# Patient Record
Sex: Female | Born: 1935 | Race: White | Hispanic: No
Health system: Southern US, Community
[De-identification: ages and names within clinical notes are randomized; demographics above are authoritative.]

---

## 1997-09-22 ENCOUNTER — Inpatient Hospital Stay (HOSPITAL_COMMUNITY): Admission: RE | Admit: 1997-09-22 | Discharge: 1997-09-25 | Payer: Self-pay | Admitting: Neurosurgery

## 2000-08-27 ENCOUNTER — Other Ambulatory Visit: Admission: RE | Admit: 2000-08-27 | Discharge: 2000-08-27 | Payer: Self-pay | Admitting: Obstetrics and Gynecology

## 2000-10-23 ENCOUNTER — Encounter: Payer: Self-pay | Admitting: Internal Medicine

## 2000-10-26 ENCOUNTER — Other Ambulatory Visit: Admission: RE | Admit: 2000-10-26 | Discharge: 2000-10-26 | Payer: Self-pay | Admitting: Internal Medicine

## 2000-10-26 ENCOUNTER — Encounter (INDEPENDENT_AMBULATORY_CARE_PROVIDER_SITE_OTHER): Payer: Self-pay

## 2002-08-03 ENCOUNTER — Encounter: Payer: Self-pay | Admitting: Internal Medicine

## 2002-08-03 ENCOUNTER — Encounter: Admission: RE | Admit: 2002-08-03 | Discharge: 2002-08-03 | Payer: Self-pay | Admitting: Internal Medicine

## 2002-08-22 ENCOUNTER — Encounter: Payer: Self-pay | Admitting: Internal Medicine

## 2002-08-22 ENCOUNTER — Encounter: Admission: RE | Admit: 2002-08-22 | Discharge: 2002-08-22 | Payer: Self-pay | Admitting: Internal Medicine

## 2002-11-07 ENCOUNTER — Encounter: Payer: Self-pay | Admitting: Neurosurgery

## 2002-11-08 ENCOUNTER — Encounter: Payer: Self-pay | Admitting: Neurosurgery

## 2002-11-08 ENCOUNTER — Inpatient Hospital Stay (HOSPITAL_COMMUNITY): Admission: RE | Admit: 2002-11-08 | Discharge: 2002-11-10 | Payer: Self-pay | Admitting: Neurosurgery

## 2003-01-03 ENCOUNTER — Encounter: Payer: Self-pay | Admitting: Neurosurgery

## 2003-01-03 ENCOUNTER — Encounter: Admission: RE | Admit: 2003-01-03 | Discharge: 2003-01-03 | Payer: Self-pay | Admitting: Neurosurgery

## 2003-02-14 ENCOUNTER — Encounter: Payer: Self-pay | Admitting: Neurosurgery

## 2003-02-14 ENCOUNTER — Inpatient Hospital Stay (HOSPITAL_COMMUNITY): Admission: RE | Admit: 2003-02-14 | Discharge: 2003-02-17 | Payer: Self-pay | Admitting: Neurosurgery

## 2003-12-10 ENCOUNTER — Ambulatory Visit (HOSPITAL_COMMUNITY): Admission: RE | Admit: 2003-12-10 | Discharge: 2003-12-10 | Payer: Self-pay | Admitting: Neurosurgery

## 2003-12-27 ENCOUNTER — Encounter: Admission: RE | Admit: 2003-12-27 | Discharge: 2003-12-27 | Payer: Self-pay | Admitting: Orthopaedic Surgery

## 2003-12-28 ENCOUNTER — Ambulatory Visit (HOSPITAL_BASED_OUTPATIENT_CLINIC_OR_DEPARTMENT_OTHER): Admission: RE | Admit: 2003-12-28 | Discharge: 2003-12-28 | Payer: Self-pay | Admitting: Orthopaedic Surgery

## 2003-12-28 ENCOUNTER — Ambulatory Visit (HOSPITAL_COMMUNITY): Admission: RE | Admit: 2003-12-28 | Discharge: 2003-12-28 | Payer: Self-pay | Admitting: Orthopaedic Surgery

## 2005-12-20 ENCOUNTER — Encounter: Admission: RE | Admit: 2005-12-20 | Discharge: 2005-12-20 | Payer: Self-pay | Admitting: Orthopaedic Surgery

## 2006-03-12 ENCOUNTER — Inpatient Hospital Stay (HOSPITAL_COMMUNITY): Admission: EM | Admit: 2006-03-12 | Discharge: 2006-03-25 | Payer: Self-pay | Admitting: Emergency Medicine

## 2006-03-13 ENCOUNTER — Encounter (INDEPENDENT_AMBULATORY_CARE_PROVIDER_SITE_OTHER): Payer: Self-pay | Admitting: *Deleted

## 2006-03-16 ENCOUNTER — Encounter (INDEPENDENT_AMBULATORY_CARE_PROVIDER_SITE_OTHER): Payer: Self-pay | Admitting: *Deleted

## 2006-03-20 ENCOUNTER — Encounter (INDEPENDENT_AMBULATORY_CARE_PROVIDER_SITE_OTHER): Payer: Self-pay | Admitting: *Deleted

## 2006-03-24 ENCOUNTER — Encounter (INDEPENDENT_AMBULATORY_CARE_PROVIDER_SITE_OTHER): Payer: Self-pay | Admitting: *Deleted

## 2006-10-23 ENCOUNTER — Encounter: Admission: RE | Admit: 2006-10-23 | Discharge: 2006-10-23 | Payer: Self-pay | Admitting: Cardiology

## 2006-10-29 ENCOUNTER — Ambulatory Visit (HOSPITAL_COMMUNITY): Admission: RE | Admit: 2006-10-29 | Discharge: 2006-10-30 | Payer: Self-pay | Admitting: Cardiology

## 2007-09-21 ENCOUNTER — Ambulatory Visit: Payer: Self-pay | Admitting: Emergency Medicine

## 2007-09-21 ENCOUNTER — Observation Stay (HOSPITAL_COMMUNITY): Admission: EM | Admit: 2007-09-21 | Discharge: 2007-09-24 | Payer: Self-pay | Admitting: Emergency Medicine

## 2007-09-21 ENCOUNTER — Encounter (INDEPENDENT_AMBULATORY_CARE_PROVIDER_SITE_OTHER): Payer: Self-pay | Admitting: Internal Medicine

## 2007-10-21 ENCOUNTER — Ambulatory Visit: Payer: Self-pay | Admitting: Emergency Medicine

## 2007-10-21 DIAGNOSIS — J309 Allergic rhinitis, unspecified: Secondary | ICD-10-CM | POA: Insufficient documentation

## 2007-10-21 DIAGNOSIS — F329 Major depressive disorder, single episode, unspecified: Secondary | ICD-10-CM

## 2007-10-21 DIAGNOSIS — I10 Essential (primary) hypertension: Secondary | ICD-10-CM | POA: Insufficient documentation

## 2007-10-21 DIAGNOSIS — I2789 Other specified pulmonary heart diseases: Secondary | ICD-10-CM

## 2007-10-21 DIAGNOSIS — I509 Heart failure, unspecified: Secondary | ICD-10-CM | POA: Insufficient documentation

## 2007-10-21 DIAGNOSIS — J45909 Unspecified asthma, uncomplicated: Secondary | ICD-10-CM | POA: Insufficient documentation

## 2007-10-21 DIAGNOSIS — E039 Hypothyroidism, unspecified: Secondary | ICD-10-CM | POA: Insufficient documentation

## 2007-10-21 DIAGNOSIS — K5730 Diverticulosis of large intestine without perforation or abscess without bleeding: Secondary | ICD-10-CM | POA: Insufficient documentation

## 2007-10-21 DIAGNOSIS — D509 Iron deficiency anemia, unspecified: Secondary | ICD-10-CM

## 2007-10-21 DIAGNOSIS — E785 Hyperlipidemia, unspecified: Secondary | ICD-10-CM

## 2007-10-21 DIAGNOSIS — F3289 Other specified depressive episodes: Secondary | ICD-10-CM | POA: Insufficient documentation

## 2007-11-12 ENCOUNTER — Ambulatory Visit (HOSPITAL_BASED_OUTPATIENT_CLINIC_OR_DEPARTMENT_OTHER): Admission: RE | Admit: 2007-11-12 | Discharge: 2007-11-12 | Payer: Self-pay | Admitting: Emergency Medicine

## 2007-11-12 ENCOUNTER — Ambulatory Visit: Payer: Self-pay | Admitting: Pulmonary Disease

## 2007-11-19 ENCOUNTER — Ambulatory Visit: Payer: Self-pay | Admitting: Emergency Medicine

## 2007-11-24 ENCOUNTER — Encounter: Payer: Self-pay | Admitting: Emergency Medicine

## 2007-12-10 ENCOUNTER — Ambulatory Visit: Payer: Self-pay | Admitting: Emergency Medicine

## 2007-12-10 DIAGNOSIS — G4730 Sleep apnea, unspecified: Secondary | ICD-10-CM

## 2007-12-10 DIAGNOSIS — G4710 Hypersomnia, unspecified: Secondary | ICD-10-CM | POA: Insufficient documentation

## 2008-02-18 ENCOUNTER — Ambulatory Visit: Payer: Self-pay | Admitting: Emergency Medicine

## 2008-03-09 ENCOUNTER — Telehealth: Payer: Self-pay | Admitting: Emergency Medicine

## 2008-05-30 DIAGNOSIS — Z860100 Personal history of colon polyps, unspecified: Secondary | ICD-10-CM | POA: Insufficient documentation

## 2008-05-30 DIAGNOSIS — R109 Unspecified abdominal pain: Secondary | ICD-10-CM | POA: Insufficient documentation

## 2008-05-30 DIAGNOSIS — K649 Unspecified hemorrhoids: Secondary | ICD-10-CM | POA: Insufficient documentation

## 2008-05-30 DIAGNOSIS — R197 Diarrhea, unspecified: Secondary | ICD-10-CM

## 2008-06-05 ENCOUNTER — Ambulatory Visit: Payer: Self-pay | Admitting: Internal Medicine

## 2008-12-18 ENCOUNTER — Ambulatory Visit: Payer: Self-pay | Admitting: Internal Medicine

## 2009-08-08 ENCOUNTER — Ambulatory Visit: Payer: Self-pay | Admitting: Emergency Medicine

## 2010-02-07 ENCOUNTER — Ambulatory Visit: Payer: Self-pay | Admitting: Emergency Medicine

## 2010-02-07 DIAGNOSIS — R0902 Hypoxemia: Secondary | ICD-10-CM | POA: Insufficient documentation

## 2010-06-06 NOTE — Assessment & Plan Note (Signed)
Summary: asthma, pulm HTN   Visit Type:  Follow-up Copy to:  self Primary Provider/Referring Provider:  Joselyn Arrow MD  CC:  PAH. Asthma.  Patient last seen 02/21/2008. Oxygen recertification needed. The patient has not worn her oxygen in over a year. she does c/o increased sob with exertion.[name]  History of Present Illness: 77 yo woman with hx HTN, anemia, DM, hypothyroidism followed by Dr Eldridge Dace. Was admitted to [facility] 5/19 - 5/22 for dyspnea, felt to have mild fluid overload and pulm edema. She responded well to diuresis. TTE showed vigorous LV fxn, slightly thickened LV wall, mild MR, dilated RA and RV, estimated PASP . CXR with CM, mild interstitial infiltrates. V/Q scan was low prob. ANA and RF were negative.   PFT's show severe AFL, PSG with OSA (RHI 15/h). Last time we started as needed albuterol. She did not want CPAP.   ROV 08/08/09 -- first f/u since '09. Tells me that she isn't feeling limited with regard to exertion by breathing. She does feel limited by back pain. She is 87% at rest on RA in the office today. Has ProAir to use as needed, but rarely feels she needs it.   Current Medications (verified): 1)  Furosemide 20 Mg  Tabs (Furosemide) .... Take 1 Tablet By Mouth Once A Day 2)  Synthroid 100 Mcg  Tabs (Levothyroxine Sodium) .... Take 1 Tablet By Mouth Once A Day 3)  Citalopram Hydrobromide 20 Mg  Tabs (Citalopram Hydrobromide) .... Take 1 Tablet By Mouth Once A Day 4)  Lisinopril 40 Mg  Tabs (Lisinopril) .... Take 1 Tablet By Mouth Once A Day 5)  Glyburide 1.25 Mg  Tabs (Glyburide) .... Take 1 Tablet By Mouth Once A Day 6)  Oxygen .... 2l As Needed 7)  Proair Hfa 108 (90 Base) Mcg/act Aers (Albuterol Sulfate) .... Inhale 2 Puffs Every 4 Hours As Needed 8)  Amlodipine Besylate 5 Mg Tabs (Amlodipine Besylate) .[name].. 1 By Mouth Daily 9)  Simvastatin 10 Mg Tabs (Simvastatin) .[name].. 1 By Mouth Daily 10)  Zyrtec Allergy 10 Mg Tabs (Cetirizine Hcl) .[name].. 1 By Mouth Daily  Allergies  (verified): 1)  ! Penicillin 2)  ! Codeine  Vital Signs:  Patient profile:   77 year old female Height:      63 inches (160.02 cm) Weight:      204 pounds (92.73 kg) O2 Sat:      87 % on Room air Temp:     98.8 degrees F (37.11 degrees C) oral Pulse rate:   66 / minute BP sitting:   130 / 70  (left arm) Cuff size:   large  Vitals Entered By: Michel Bickers CMA (August 08, 2009 11:35 AM)  O2 Sat at Rest %:  87 O2 Flow:  Room air  O2 Sat Comments The patient came in on room air and her sats were at 87% after walking. She was placed on 2 liters of oxygen and her sats recovered to 96%.Michel Bickers CMA  August 08, 2009 11:36 AM  Physical Exam  General:  elderly and hard of hearing. Moves around very slowly. Head:  normocephalic and atraumatic Eyes:  conjunctiva and sclera clear Nose:  no deformity, discharge, inflammation, or lesions Mouth:  no deformity or lesions Neck:  Supple; no masses or thyromegaly. Lungs:  Clear throughout to auscultation. Heart:  Regular rate and rhythm; no murmurs, rubs or bruits. Abdomen:  not examined Msk:  Symmetrical with no gross deformities. Normal posture. Extremities:  no  clubbing, cyanosis, edema, or deformity noted Neurologic:  intact Psych:  alert and cooperative; normal mood and affect; normal attention span and concentration   Impression & Recommendations:  Problem # 1:  ASTHMA (ICD-493.90) Severe by PFT, but she denies significant symptoms.  - as needed SABA (ventolin sample given) - requalified for oxygen with exertion today, 87% after walking to exam room on RA - ROV 6 mon or as needed   Problem # 2:  PULMONARY HYPERTENSION (ICD-416.8)  Orders: Est. Patient Level III ([address]109)  Medications Added to Medication List This Visit: 1)  Amlodipine Besylate 5 Mg Tabs (Amlodipine besylate) .[name].. 1 by mouth daily 2)  Simvastatin 10 Mg Tabs (Simvastatin) .[name].. 1 by mouth daily 3)  Zyrtec Allergy 10 Mg Tabs (Cetirizine hcl) .[name].. 1 by mouth  daily  Patient Instructions: 1)  You qualify for oxygen to be worn with exertion. We will reorder this. Please wear the oxygen on 2L/min whenever you are walking.  2)  Use ventolin 2 puffs up to every 4 hours if needed for shortness of breath.  3)  Follow up with Dr Delton Coombes in 6 months or as needed  Prescriptions: PROAIR HFA 108 (90 BASE) MCG/ACT AERS (ALBUTEROL SULFATE) inhale 2 puffs every 4 hours as needed  #1 x 5   Entered and Authorized by:   Leslye Peer MD   Signed by:   Leslye Peer MD on 08/08/2009   Method used:   Electronically to        [facility] [address] [phone] (retail)       [address]       [place], [place]  [id number]       Ph: [phone] or [phone]       Fax: [phone]   RxID:   [id number]    Immunization History:  Influenza Immunization History:    Influenza:  historical (03/05/2009)

## 2010-06-06 NOTE — Assessment & Plan Note (Signed)
Summary: asthma, hypoxemia   Visit Type:  Follow-up Copy to:  self Primary Provider/Referring Provider:  Joselyn Arrow MD  CC:  Asthma...PAH...no complaints today...would like to get her flu shot today.  History of Present Illness: 77 yo woman with hx HTN, anemia, DM, hypothyroidism followed by Dr Eldridge Dace. Was admitted to [facility] 5/19 - 5/22 for dyspnea, felt to have mild fluid overload and pulm edema. She responded well to diuresis. TTE showed vigorous LV fxn, slightly thickened LV wall, mild MR, dilated RA and RV, estimated PASP . CXR with CM, mild interstitial infiltrates. V/Q scan was low prob. ANA and RF were negative.   PFT's show severe AFL, PSG with OSA (RHI 15/h). Last time we started as needed albuterol. She did not want CPAP.   ROV 08/08/09 -- first f/u since '09. Tells me that she isn't feeling limited with regard to exertion by breathing. She does feel limited by back pain. She is 87% at rest on RA in the office today. Has ProAir to use as needed, but rarely feels she needs it.   ROV 02/07/10 -- f/u for her cardiac disease, AFL on PFT's, OSA. Has SABA available to use as needed but has not used it. Also has O2 but rarely uses. Denies any SOB or limitation related to breathing. Her activity is limited by back pain. Following w Dr Eldridge Dace.   Current Medications (verified): 1)  Furosemide 20 Mg  Tabs (Furosemide) .Marland Kitchen.. 1 By Mouth Every Other Day 2)  Synthroid 100 Mcg  Tabs (Levothyroxine Sodium) .... Take 1 Tablet By Mouth Once A Day 3)  Citalopram Hydrobromide 20 Mg  Tabs (Citalopram Hydrobromide) .... Take 1 Tablet By Mouth Once A Day 4)  Lisinopril 40 Mg  Tabs (Lisinopril) .... Take 1 Tablet By Mouth Once A Day 5)  Glyburide 1.25 Mg  Tabs (Glyburide) .... Take 1 Tablet By Mouth Once A Day 6)  Oxygen .... 2l As Needed 7)  Proair Hfa 108 (90 Base) Mcg/act Aers (Albuterol Sulfate) .... Inhale 2 Puffs Every 4 Hours As Needed 8)  Amlodipine Besylate 5 Mg Tabs (Amlodipine Besylate) .Marland Kitchen..  1 By Mouth Daily 9)  Simvastatin 10 Mg Tabs (Simvastatin) .Marland Kitchen.. 1 By Mouth Daily 10)  Zyrtec Allergy 10 Mg Tabs (Cetirizine Hcl) .Marland Kitchen.. 1 By Mouth Daily As Needed  Allergies (verified): 1)  ! Penicillin 2)  ! Codeine  Vital Signs:  Patient profile:   77 year old female Height:      63 inches (160.02 cm) Weight:      181.38 pounds (82.45 kg) BMI:     32.25 O2 Sat:      93 % on Room air Temp:     97.6 degrees F (36.44 degrees C) oral Pulse rate:   62 / minute BP sitting:   142 / 80  (left arm) Cuff size:   regular  Vitals Entered By: Michel Bickers CMA (February 07, 2010 10:11 AM)  O2 Sat at Rest %:  93 O2 Flow:  Room air CC: Asthma...PAH...no complaints today...would like to get her flu shot today Comments Medications reviewed with patient Michel Bickers CMA  February 07, 2010 10:11 AM   Physical Exam  General:  elderly and hard of hearing. Moves around very slowly. Head:  normocephalic and atraumatic Eyes:  conjunctiva and sclera clear Nose:  no deformity, discharge, inflammation, or lesions Mouth:  no deformity or lesions Neck:  Supple; no masses or thyromegaly. Lungs:  Clear throughout to auscultation. Heart:  Regular rate  and rhythm; no murmurs, rubs or bruits. Abdomen:  not examined Msk:  Symmetrical with no gross deformities. Normal posture. Extremities:  no clubbing, cyanosis, edema, or deformity noted Neurologic:  intact Psych:  alert and cooperative; normal mood and affect; normal attention span and concentration   Impression & Recommendations:  Problem # 1:  ASTHMA (ICD-493.90) ProAir as needed   Problem # 2:  ALLERGIC RHINITIS (ICD-477.9)  Problem # 3:  HYPOXEMIA (ICD-799.02)  Start O2 at 2L/min more reliably. She will continue if she notices a clinical benefit  Orders: Est. Patient Level III (96045)  Medications Added to Medication List This Visit: 1)  Furosemide 20 Mg Tabs (Furosemide) .Marland Kitchen.. 1 by mouth every other day 2)  Zyrtec Allergy 10 Mg Tabs  (Cetirizine hcl) .Marland Kitchen.. 1 by mouth daily as needed  Patient Instructions: 1)  We will start using oxygen at 2L/min.  2)  Keep your ProAir available to use as needed  3)  Full Shot today.  4)  Follow up with Dr Delton Coombes in 6 months or as needed.   Appended Document: asthma, hypoxemia-flu shot documentation    Clinical Lists Changes  Orders: Added new Service order of Flu Vaccine 29yrs + MEDICARE PATIENTS (W0981) - Signed Added new Service order of Administration Flu vaccine - MCR (X9147) - Signed Observations: Added new observation of FLU VAX VIS: 11/27/2009 version (02/07/2010 11:33) Added new observation of FLU VAXLOT: [id number] (02/07/2010 11:33) Added new observation of FLU VAXMFR: [employer] (02/07/2010 11:33) Added new observation of FLU VAX EXP: 11/02/2010 (02/07/2010 11:33) Added new observation of FLU VAX DSE: 0.51ml (02/07/2010 11:33) Added new observation of FLU VAX: Fluvax 3+ (02/07/2010 11:33)     Flu Vaccine Consent Questions     Do you have a history of severe allergic reactions to this vaccine? no    Any prior history of allergic reactions to egg and/or gelatin? no    Do you have a sensitivity to the preservative Thimersol? no    Do you have a past history of Guillan-Barre Syndrome? no    Do you currently have an acute febrile illness? no    Have you ever had a severe reaction to latex? no    Vaccine information given and explained to patient? yes    Are you currently pregnant? no    Lot Number:[id number]   Exp Date:11/02/2010   Site Given  Right Deltoid IMmedflu  Elray Buba RN  February 07, 2010 11:35 AM

## 2010-07-08 ENCOUNTER — Other Ambulatory Visit: Payer: Self-pay | Admitting: Orthopedic Surgery

## 2010-07-08 DIAGNOSIS — M545 Low back pain: Secondary | ICD-10-CM

## 2010-07-11 ENCOUNTER — Ambulatory Visit
Admission: RE | Admit: 2010-07-11 | Discharge: 2010-07-11 | Disposition: A | Discharge status: Home / Self Care | Payer: Medicare Other | Source: Ambulatory Visit | Attending: Orthopedic Surgery | Admitting: Orthopedic Surgery

## 2010-07-11 ENCOUNTER — Other Ambulatory Visit: Payer: Self-pay

## 2010-07-11 ENCOUNTER — Other Ambulatory Visit: Payer: Self-pay | Admitting: Orthopedic Surgery

## 2010-07-11 DIAGNOSIS — M549 Dorsalgia, unspecified: Secondary | ICD-10-CM

## 2010-09-17 NOTE — Discharge Summary (Signed)
NAMEALMETER, [name]                 ACCOUNT NO.:  [id number]   MEDICAL RECORD NO.:  [id number]          PATIENT TYPE:  OIB   LOCATION:  4741                         FACILITY:  [facility]   PHYSICIAN:  Francisca December, M.D.  DATE OF BIRTH:  04/28/1918   DATE OF ADMISSION:  10/29/2006  DATE OF DISCHARGE:  10/30/2006                               DISCHARGE SUMMARY   DISCHARGE DIAGNOSES:  1. Sinus node dysfunction status post dual chamber pacemaker, Guidant      type.  2. Diabetes mellitus, oral agent.  3. Hypertension.  4. Hypothyroidism, treated.  5. Depression, treated.  6. ALLERGY TO PENICILLIN, WHICH CAUSES A RASH.   PAST SURGICAL HISTORY:  1. Bilateral cataract removal.  2. Appendectomy.  3. Back surgery x4.  4. Tonsillectomy.  5. Hysterectomy.  6. Long-term medication use.   HOSPITAL COURSE:  [name] is an 77 year old female who complained of  some fatigue, and does have varicosity due to 24-hour ambulatory.  Continuous EKG monitoring initially showed significant bradycardia in  the 30-40 range with some junctional escape beats.   She was admitted to [facility] on October 29, 2006 and underwent a dual-  chamber pacemaker insertion, Guidant type on October 29, 2006.   She remained in the hospital overnight, and was ready for discharge to  home.  Assuming a chest x-ray shows no pneumothorax, the patient is  clinically stable, the patient will go home.  The patient is discharged  to home in stable and improved condition.   DISCHARGE MEDICATIONS:  1. Synthroid 100 mcg a day.  2. Lisinopril 40 mg a day.  3. Glyburide 1.25 mg a day.  4. Celexa 10 mg a day.  5. Vicodin 5/500 one p.o. q.6 hours p.r.n. pain.   Stop taking Lasix.  Her creatinine on preoperative labs was 1.4.  She  may need to restart this at a later date.   FOLLOWUP:  Follow up with Dr. Amil Amen, extender, on November 09, 2006 at  11:10 a.m.  Follow up with Dr. Eldridge Dace on August 4 at 11 a.m.   DISCHARGE  INSTRUCTIONS:  Wound care and activity instruction sheet was  given to the patient.  She is to remain on a low-sodium, heart healthy  diabetic diet.      Guy Franco, P.A.      Francisca December, M.D.  Electronically Signed    LB/MEDQ  D:  10/30/2006  T:  10/30/2006  Job:  [id number]

## 2010-09-17 NOTE — Discharge Summary (Signed)
NAMEAVIAN, GREENAWALT                 ACCOUNT NO.:  [id number]   MEDICAL RECORD NO.:  [id number]          PATIENT TYPE:  OBV   LOCATION:  4732                         FACILITY:  [facility]   PHYSICIAN:  Ramiro Harvest, MD    DATE OF BIRTH:  1917-11-09   DATE OF ADMISSION:  09/21/2007  DATE OF DISCHARGE:  09/24/2007                               DISCHARGE SUMMARY   ATTENDING PHYSICIAN:  Ramiro Harvest, MD.   PRIMARY CARE PHYSICIAN:  [facility]   DISCHARGE DIAGNOSES:  1. Severe pulmonary hypertension.  2. Mild congestive heart failure.  3. Sinus node dysfunction, status post pacemaker.  4. Iron-deficiency anemia.  5. Hypothyroidism.  6. Type 2 diabetes.  7. Hypertension.  8. Depression.  9. Diverticulosis.  10.Hemorrhoids.  11.Status post appendectomy.   DISCHARGE MEDICATIONS:  1. Lasix 20 mg p.o. every other day.  2. Home oxygen 2 liters per minute.  3. Iron sulfate 325 mg p.o. b.i.d.  4. Colace 100 mg p.o. daily.  5. Synthroid 100 mcg p.o. daily.  6. Norvasc 5 mg p.o. daily.  7. Citalopram 20 mg p.o. daily.  8. Lisinopril 40 mg p.o. daily.  9. Glyburide 1.25 mg p.o. daily.  10.Ambien 5 mg p.o. at bedtime p.r.n.   DISPOSITION AND FOLLOW UP:  The patient will be discharged home with  home health PT and home oxygen.  The patient is to schedule a followup  appointment with PCP in the next 1-2 weeks.  A basic metabolic profile  needs to be checked to follow up on the patient's renal function and the  electrolytes if worsening renal function.  The patient's Lasix may be  decreased and adjusted.  CBC also need to be checked to follow up on the  patient's hemoglobin as from workup in the hospital looks as if the  patient has iron-deficiency anemia.  The patient was placed on iron  sulfate 325 mg b.i.d.  The patient has not had a recent colonoscopy.  She will likely need one as an outpatient.  The patient is also to call  to schedule a followup appointment with Dr.  Delton Coombes of pulmonary of  [facility] for followup on the severe pulmonary hypertension.  The patient is also to follow up with Dr. Lance Muss of  Cardiology.  Once the patient is stabilized, we will likely need a  followup outpatient 2-D echo to follow up on the pulmonary hypertension  and right ventricular dilatation.  The patient also may need a study for  obstructive sleep apnea as well.   CONSULTATIONS DONE:  1. The patient was consulted on by Cardiology, Dr. Lance Muss      on Sep 21, 2007.  The patient was also consulted on by Dr. Delton Coombes of      [facility] on Sep 23, 2007.   PROCEDURES PERFORMED:  1. A pulmonary function tests were performed on Sep 23, 2007.  2. A pacemaker interrogation was done on Sep 22, 2007, which showed      four episodes of AFib on Sep 03, 2007.  3. A VQ scan was  done on Sep 20, 2007, which showed a low probability      for pulmonary embolism.  4. Chest x-ray was done on Sep 20, 2007, that showed mildly      progressive cardiomegaly with interval mild changes of congestive      heart failure.  Repeat chest x-ray was done Sep 21, 2007, that      showed cardiomegaly and mild edema with small left effusion.  5. Chest x-ray done Sep 22, 2007, showed moderate-to-markedly cardiac      enlargement without heart failure.  6. A 2-D echo was done on Sep 21, 2007, which showed overall vigorous      left ventricular systolic function with EF 65-70%, no evidence of      left ventricular regional wall motion abnormalities, left      ventricular wall thickness, upper limits of normal aortic valve,      mildly calcified mild mitral annular calcification, mild mitral      valvular regurgitation, and right ventricle was dilated.  Right      ventricular systolic function was mildly reduced.  Estimated peak      pulmonary artery systolic pressure was markedly increased.  Right      atrium was mildly dilated.  Inferior vena cava was mildly dilated.       PA systolic pressure was 71.   BRIEF ADMISSION HISTORY AND PHYSICAL:  Ms. Virgin Zellers is an 77 year old  female with history of CHF followed by Dr. Eldridge Dace, history of dual-  chamber pacemaker inserted June 2008, presented with worsening shortness  of breath for the past few days prior to admission.  The patient has  just returned from a trip to the beach where she did not follow a diet.  Of note, the patient has not been on a Lasix for quite a while and had  been naturally well-compensated up until a few days ago.  On  presentation to the ED, x-rays obtained shows some mild edema and  cardiomegaly.  The patient was given some Lasix with excellent  symptomatic improvement.  Given [place], she had a  VQ scan, which was done in the emergency room to assess for probability  of pulmonary embolism and [facility] were called to admit the  patient.   PHYSICAL EXAM:  VITAL SIGNS: Per admitting physician, temperature 97.4,  respirations 20, blood pressure 161/63, heart rate of 70s, O2 sats 96%  on 4 liters and 95% on 2 liters physical exam.  GENERAL: The patient is an obese female lying on stretcher in no acute  distress.  HEENT: Normocephalic and atraumatic.  Pupils are equal, round, and  reactive to light.  Extraocular movements intact.  Moist mucous  membranes.  RESPIRATORY: Diminished breath sounds at the bases with occasional  crackles.  CARDIOVASCULAR: Regular rate and rhythm.  No murmurs, rubs, or gallops.  ABDOMEN: Slightly distended, soft, nontender, and positive bowel sounds.  EXTREMITIES: Trace bilateral lower extremity edema.  NEUROLOGICAL: The patient was alert and oriented x3.  Cranial nerves II-  XII are grossly intact.  No focal deficits.  RECTAL EXAM: Hemoccult positive.  The patient does have a history of  hemorrhoids.   ADMISSION LABS:  CBC; white count 13.4, hemoglobin 10.9, and platelets  276.  Sodium 138, potassium 4.2, and creatinine 1.3.   Of note per review  of records, the patient's creatinine was noted in 2008, at 1.4,  hemoglobin since 2007 has been 9.8-10.  Chest x-ray  showing cardiomegaly  and increased edema.  Hemoccult positive stools.  BNP of 188.  EKG was  not obtained at the time of admission.   HOSPITAL COURSE:  1. The patient was admitted with a mild CHF.  The patient was placed      on IV Lasix.  A BNP was obtained with results as stated above.      Cardiac enzymes were cycled, which came back negative.  The patient      had clinical improvement with diuresis.  TSH was also obtained,      which was within normal limits.  The patient was maintained on her      thyroid medications.  Cardiology was consulted on the patient.  The      patient was seen in consultation.  A 2-D echo was done, results as      stated above.  The patient had a good diuresis with diuretics and      condition was improved by day of discharge.  2-D echo showed the      patient had severe pulmonary hypertension and such a pulmonary      consult was obtained.  The patient was seen in consultation by Dr.      Delton Coombes who had ordered some lab works to rule out secondary courses      of pulmonary hypertension, which were pending at the time of      discharge.  Pulmonary function tests were also pending and once the      patient stabilized, the plan was to repeat a 2-D echo to follow up      on her pulmonary artery pressures as well as right ventricle.  The      patient will follow up as an outpatient with Dr. Delton Coombes for further      workup of her severe pulmonary hypertension, which may also include      a study for obstructive sleep apnea.  The patient will also follow      up with cardiology as an outpatient as well.  By day of discharge,      the patient was in a stable and improved condition and the      patient's mild CHF had been well compensated.  The patient will be      discharged home on Lasix 20 mg every other day as well as home O2       and keep her well oxygenated secondary to her severe pulmonary      hypertension.  2. Sinus node dysfunction, status post pacemaker.  The patient's      pacemaker was interrogated during the hospitalization and it was      noted per interrogation that the patient had four brief episodes      AFib on Sep 03, 2007.  The patient's pacemaker was adjusted      accordingly and the patient remained in a normal sinus rhythm.  3. Iron-deficiency anemia.  It was noted that the patient had come in      anemic.  Hemoccult was positive.  The patient did not show any      evidence of any acute GI bleed.  The patient's hemoglobin remained      stable.  The patient was started on iron sulfate 325 mg b.i.d.  If      the patient has not had a recent colonoscopy, we will likely need a      colonoscopy on her.  4. Hypothyroidism, stable  throughout the hospitalization.  The patient      was maintained on a home dose of Synthroid.  The rest of the      patient's chronic medical issues were stable throughout the      hospitalization and the patient will be discharged in stable and      improved condition.  On day of discharge, vital signs temperature      98.1, pulse of 60, blood pressure 122/49, respiratory rate 18, and      sating 97% on 2 liters nasal cannula.   DISCHARGE LABS:  BMET; sodium 139, potassium 3.9, chloride 94, bicarb  35, glucose 95, BUN 30, creatinine 1.47, and a calcium of 8.7.  CBC on  day of discharge; white count 10.3, hemoglobin 9.2, hematocrit 28.6,  platelets 261, and BNP of 38.  It was a pleasure taking care of Ms.  Odelle Rennaker.      Ramiro Harvest, MD  Electronically Signed     DT/MEDQ  D:  09/24/2007  T:  09/25/2007  Job:  [id number]   cc:   Ramiro Harvest, MD  Corky Crafts, MD  Leslye Peer, MD

## 2010-09-17 NOTE — Consult Note (Signed)
[name], [name]                 ACCOUNT NO.:  [id number]   MEDICAL RECORD NO.:  [id number]          PATIENT TYPE:  OBV   LOCATION:  4732                         FACILITY:  [facility]   PHYSICIAN:  [name], MDDATE OF BIRTH:  August 21, 1917   DATE OF CONSULTATION:  09/21/2007  DATE OF DISCHARGE:                                 CONSULTATION   REASON FOR VISIT:  1. Dyspnea.  2. Pulmonary hypertension.  3. Edema.  4. Congestive heart failure.   HISTORY OF PRESENT ILLNESS:  The patient is an 77 year old woman who had  progressive dyspnea for the past week.  She was at the beach.  She felt  like she walked a lot and also ate out a lot.  She ended up going to the  emergency room by EMS.  She has not had any chest pain, palpitation,  orthopnea, PND, dizziness, or syncope.  Her chest x-ray showed some  cardiomegaly with mild CHF, although her BNP was only a 188.  She did  feel like her lower extremities were especially swelled.  V/Q scan with  low probability for PE.   PAST MEDICAL HISTORY:  1. Sick sinus syndrome.  2. Status post pacemaker implantation.  3. Diabetes.  4. Hypertension.  5. Hypothyroidism.  6. Depression.   PAST SURGICAL HISTORY:  1. Bilateral cataract removal.  2. Appendectomy.  3. Back surgery.  4. Tonsillectomy.  5. Hysterectomy.   SOCIAL HISTORY:  She does not smoke, drink, or use drugs.   FAMILY HISTORY:  Noncontributory.   ALLERGIES:  PENICILLIN and CODEINE.   MEDICATIONS:  1. Norvasc 5 mg a day.  2. Celexa 20 mg a day.  3. Lasix 40 mg IV b.i.d.  4. Diabeta 1.25 mg a day.  5. Synthroid 100 mcg a day.  6. Prinivil 40 mg a day.  7. Protonix 40 mg a day.   REVIEW OF SYSTEMS:  Significant for lower extremity swelling, shortness  of breath as described above.  No bleeding problems.  No nausea or  vomiting.  No dysuria.  All other systems negative.   PHYSICAL EXAMINATION:  VITAL SIGNS:  152/60, pulse 66.  GENERAL:  She is awake and alert, in no  apparent distress.  HEAD:  Normocephalic and atraumatic.  EYES:  Extraocular movements intact.  NECK:  No JVD.  CARDIOVASCULAR:  Regular rate and rhythm.  S1 and S2.  Soft systolic  murmur.  LUNGS:  Clear to auscultation bilaterally.  ABDOMEN:  Soft, nontender, and nondistending.  EXTREMITIES:  Show trace pretibial edema.  SKIN:  No rash.   LAB WORK:  Shows a BNP of 188.  Troponin 0.03.  Hemoglobin 8.9.  Creatinine 1.2.  ECG shows normal sinus rhythm with right bundle branch  block.   ASSESSMENT:  An 77 year old who by echo today had normal left  ventricular function but marked pulmonary hypertension and a dilated  right ventricle.   PLAN:  1. Her edema is likely secondary to the increased pulmonary      hypertension.  Continue diuresis for now.  Her legs are better per  her report.  She overall feels better.  In terms of long-term      management for her pulmonary hypertension, I will not plan any      invasive testing.  Certainly, I will consider long-term continuous      oxygen.  She is heme-positive, I will not use Coumadin.  There is      no obvious cardiac cause of her pulmonary hypertension by her      echocardiogram.  It is possible she may have some intrinsic lung      disease.  2. Continue management of diabetes and hypothyroidism.  3. Her blood pressure has been well controlled as an outpatient.  4. We will follow the patient while she is in the hospital.      Claire Crafts, MD  Electronically Signed     JSV/MEDQ  D:  09/22/2007  T:  09/22/2007  Job:  [id number]

## 2010-09-17 NOTE — Op Note (Signed)
[name], [name]                 ACCOUNT NO.:  [id number]   MEDICAL RECORD NO.:  [id number]          PATIENT TYPE:  OIB   LOCATION:  2859                         FACILITY:  [facility]   PHYSICIAN:  Francisca December, M.D.  DATE OF BIRTH:  [birth year]   DATE OF PROCEDURE:  10/29/2006  DATE OF DISCHARGE:                               OPERATIVE REPORT   PROCEDURE PERFORMED:  1. Insertion dual chamber permanent transvenous pacemaker.  2. Left subclavian venogram.   INDICATION:  [name] is an 77 year old woman with progressive  fatigue and dyspnea.  She has been found by her Cardionet Monitor to  have bradycardia, sometimes beating below 40 beats per minute.  She is,  therefore, brought to the catheterization laboratory for insertion of a  dual chamber permanent transvenous pacemaker with a diagnosis of sinus  node dysfunction and symptomatic bradycardia.   PROCEDURE NOTE:  The patient is brought to the cardiac catheterization  laboratory in a fasting state.  The left prepectoral region was prepped  and draped in the usual sterile fashion.  Local anesthesia was obtained  with infiltration of 1% lidocaine with epinephrine throughout the left  pectoral region.  A 6-7 cm incision was then made in the deltopectoral  groove and this was carried down by sharp dissection and electrocautery  to the prepectoral fascia.  There, a plane was lifted and a pocket  formed inferiorly and medially utilizing blunt dissection and  electrocautery.  The pocket was then packed with 1% kanamycin soaked  gauze.   A left subclavian venogram was then performed with a peripheral  injection of 20 mL of Omnipaque.  A digital cineangiogram was obtained  and road mapped to guide future left subclavian puncture.  The  venogram did demonstrate the vein to be widely patent and coursing in a  normal fashion over the anterior surface of the first rib and beneath  the middle third of the clavicle.  There was no evidence  for persistence  of the left superior vena cava.   The left subclavian vein was then punctured twice using an 18 gauge thin  wall needle.  Two .038 inch J guidewires were placed.  Over the initial  guidewire, the 7 French tearaway sheath and dilator were advanced.  The  dilator and wire were removed and the ventricular lead was advanced to  the level of the right atrium.  The sheath was torn away.  A figure-of-  eight hemostasis suture was placed around the entry site in the  pectoralis muscle to control back bleeding.  Using standard technique  and fluoroscopic landmarks, the lead was manipulated into the right  ventricular apex.  There, excellent pacing parameters were obtained as  will be noted below.  The lead was tested for diaphragmatic pacing at 10  volts and none was found.  The lead was then sutured into place using  three separate silk ligatures.  Over the remaining guidewire, a second 7  French tearaway sheath and dilator were advanced.  The dilator and wire  were removed and the atrial lead was advanced to the  level of the right  atrium.  The sheath was torn away.  Using standard technique and  fluoroscopic landmarks, the lead was manipulated into the right atrial  appendage.  This was an active fixation device and the screw was  advanced as appropriate.  It was tested for diaphragmatic pacing at 10  volts and none was found.  Excellent pacing parameters were obtained and  this is noted below.  The lead was sutured into place using three  separate silk ligatures.   The kanamycin soaked gauze was removed in the pocket.  The pocket was  copiously irrigated using 1% kanamycin solution.  The pacing generator  was then attached to the pacing leads, carefully identifying each by its  serial number, and placing each into the appropriate receptacle.  Each  lead was tightened into place and tested for security.  The leads were  then wound beneath the pacing generator and the  generator was placed in  the pocket.  An 0 silk anchoring suture was applied.  The wound was  inspected for bleeding and none was found.  The wound was then closed  using 2-0 Vicryl in a running fashion in the subcutaneous tissue.  Two  layers applied.  The skin was approximated using 3-0 Vicryl in a running  subcuticular fashion.  Steri-Strips and a sterile dressing were applied  and the patient was transported to the recovery area in stable  condition.   EQUIPMENT DATA:  The pacing generator is a [employer] DR, model number C2278664, serial number [id number].  The atrial lead is a  [employer] model number [id number], serial number [id number].  The ventricular lead  is a Guidant model number [id number], serial number [id number].   PACING DATA:  The ventricular lead detected a 6.5 mV R-wave.  The pacing  threshold was 0.6 volts at 0.5 milliseconds pulse width.  The impedance  was 685 ohms resulting in a current at capture threshold of 0.9 MA.  The  atrial lead detected 2.6 mV P-wave.  The pacing threshold was 0.8 volts  at 0.5 milliseconds pulse width.  The impedance was 424 ohms resulting  in a current at capture threshold of 2.2 MA.      Francisca December, M.D.  Electronically Signed     JHE/MEDQ  D:  10/29/2006  T:  10/29/2006  Job:  [id number]   cc:   Corky Crafts, MD

## 2010-09-17 NOTE — H&P (Signed)
NAMEFARRAH, SKODA                 ACCOUNT NO.:  [id number]   MEDICAL RECORD NO.:  [id number]          PATIENT TYPE:  EMS   LOCATION:  MAJO                         FACILITY:  [facility]   PHYSICIAN:  Michiel Cowboy, MDDATE OF BIRTH:  August 28, 1917   DATE OF ADMISSION:  09/20/2007  DATE OF DISCHARGE:                              HISTORY & PHYSICAL   PRIMARY CARE PHYSICIAN:  [facility].   CHIEF COMPLAINT:  Shortness of breath.   The patient is an 77 year old female with history of CHF, followed by  [facility] Dr. Eldridge Dace; with history of dual-chamber pacemaker  inserted in June 2008.  He presents with worsening shortness of breath  for the past few days.  The patient has just returned from a trip to the  beach, where she did not follow her diet.  Of note, the patient has not  been on Lasix for quite some time; has been actually well compensated up  until a few days ago.   On presentation to the emergency department, x-ray was obtained that  showed some mild edema and cardiomegaly.  She was given Lasix with  excellent improvement.  Given the recent trip to the [place], she had a VQ  scan in the emergency department to assess the probability for PE.  [facility] called to admit the patient.   REVIEW OF SYSTEMS:  Negative for chest pain; positive for shortness of  breath and dyspnea on exertion.  No fevers and no chills.  No urinary  burning.  No constipation.  No diarrhea.  She denies any black melena or  black stools.   PAST MEDICAL HISTORY:  1. Heart failure, followed by Dr. Eldridge Dace.  2. Diabetes mellitus.  3. Hypertension.  4. Hypothyroidism.  5. Depression.  6. Appendectomy.  7. Hysterectomy.  8. Diverticulosis.  9. Hemorrhoids.   SOCIAL HISTORY:  The patient never smoked nor drank.  She lives at home.   FAMILY HISTORY:  Noncontributory.   ALLERGIES:  CODEINE, PENICILLIN.   MEDICATIONS:  1. Amlodipine 5 mg p.o. daily.  2.  Citalopram 20 mg p.o. daily.  3. Glyburide 1.25 mg p.o. daily.  4. Lisinopril 40 mg p.o. daily.  5. Synthroid 100 mcg p.o. daily.   VITALS:  Temperature 97.4, respirations 20, blood pressure 161/63 (down  to 139/62), heart rate 70s, O2 saturation 96% on 4 liters and 95% on 2  liters.   PHYSICAL EXAMINATION:  The patient is an obese female, lying on the  stretcher in no acute distress.  HEENT:  Head not traumatic.  Moist mucous membranes.  LUNGS:  Somewhat diminished breath sounds at the bases, with occasional  crackles.  HEART:  Regular rate and rhythm.  No murmurs, rubs or gallops.  EXTREMITIES:  Lower extremities with trace edema.  ABDOMEN:  Slightly distended, but soft and nontender.  NEUROLOGIC:  Intact.  RECTAL:  Per emergency department, Hemoccult positive.  The patient  endorses history of hemorrhoids.   LABORATORY STUDIES:  White blood cell count 13.4, hemoglobin 10.9,  platelets 276.  Sodium 138, potassium 4.2, creatinine 1.3.  Of  note per  review of records, the patient's creatinine was noted in 2008 at 1.4.  Her hemoglobin since 2007 has been 9.8-10.  Chest x-ray showing  cardiomegaly and increased edema; Hemoccult positive stool.  BNP 188.   EKG:  Not obtained.   ASSESSMENT AND PLAN:  This is an 77 year old female with history of  heart failure; admitted with shortness of breath, likely secondary to  heart failure.  Will give Lasix IV.  Will cycle cardiac enzymes.  Obtain  TSH.  Obtain daily weights and orthostatics.  Strict I&Os.   Of note, the patient does not have an echocardiogram on the system.  Will obtain to determine progression of congestive heart failure.  Likely the congestive heart failure was exacerbation secondary to diet  or indiscretion.  The patient may need to leave on Lasix .  Ortho has  not been taken from the past.  Will put her on no added sodium diet  here.   1. Hypothyroidism.  Check TSH and continue Synthroid.  2. Diabetes.  Continue  Glyburide and sliding-scale.  3. Hypertension.  Continue amlodipine.  4. Anemia.  Type and screen.  Anemia panel.  The patient reports      having not had a colonoscopy for 5 years.  May need to have a      colonoscopy as an outpatient; depending on anemia workup.  Will      Hemoccult stools   Currently no indication for transfusion.  The patient is hemodynamically  stable.  Hemoglobin is about 10.   PERIPHERAL ACCESS:  SCDs, Protonix.   CODE STATUS:  The patient is DNR/DNI, as per the family and her.      Michiel Cowboy, MD  Electronically Signed     AVD/MEDQ  D:  09/20/2007  T:  09/21/2007  Job:  [id number]

## 2010-09-17 NOTE — Procedures (Signed)
[name], [name]                 ACCOUNT NO.:  [id number]   MEDICAL RECORD NO.:  [id number]          PATIENT TYPE:  OUT   LOCATION:  SLEEP CENTER                 FACILITY[facility]  [facility]   PHYSICIAN:  Coralyn Helling, MD        DATE OF BIRTH:  November 09, 1917   DATE OF STUDY:  11/12/2007                            NOCTURNAL POLYSOMNOGRAM   REFERRING PHYSICIAN:  Leslye Peer, MD   INDICATION FOR STUDY:  Ms. Burress is an 77 year old female who has a  history of pulmonary hypertension, congestive heart failure,  hypertension, hypothyroidism, depression and diabetes.  She also has  symptoms of sleep disruption and excessive of daytime sleepiness.  She  is referred to the sleep lab for evaluation of hypersomnia with  obstructive sleep apnea.   Height 5 feet 4 inches, Weight 193 pounds, BMI 33, Neck Size 17.5  inches.   EPWORTH SLEEPINESS SCORE:  2   MEDICATIONS:  Ambien, ferrous sulfate, Synthroid, Norvasc, Citalopram,  lisinopril, glyburide.  The patient took 5 mg of Ambien at 9:30 and again at 11:15 p.m.   SLEEP ARCHITECTURE:  Total recording time was 396 minutes.  Total sleep  time was 273 minutes.  Sleep efficiency was 69%.  Sleep latency was 59  minutes which was prolonged.  REM latency was 272 minutes which was  prolonged.  This study was notable for lack of slow wave sleep and a  reduction in the percentage of REM sleep to 5.5% of study.  The patient  slept exclusively  in the nonsupine position.   RESPIRATORY DATA:  The average respiratory rate was 15.  The overall  apnea/hypopnea index was 12.7.  The REM apnea/hypopnea index was 28.  NREM apnea/hypopnea index was 12.  Her events were exclusively  obstructive in nature.   OXYGEN DATA:  The baseline oxygenation was 88%.  The study was done  without the use of supplemental oxygen.  The oxygen saturation nadir was  74%.  The patient spent a total of 225 minutes with an oxygen saturation  less than 90% and 71 minutes with an oxygen  saturation less than 88%.   CARDIAC DATA:  The average heart rate was 60 and the rhythm strip showed  normal sinus rhythm with PVCs and sinus arrhythmia.   MOVEMENT-PARASOMNIA:  The periodic limb movement index was 15.   IMPRESSIONS-RECOMMENDATIONS:  This study shows evidence for mild sleep  apnea with the apnea/hypopnea index of 15 and oxygen saturation nadir of  74%.  Of note, the patient did not have much REM sleep or supine sleep,  and therefore, the severity of her sleep apnea may be under estimated.   In addition, she did have significant episodes of oxygen desaturation in  the absence of respiratory events.   What I would recommend is to have the patient return to the sleep lab  for a CPAP titration study.  At that time, further consideration could  be given to starting the patient on supplemental oxygen in addition to  CPAP therapy.   The patient should be strongly counseled with regard to the importance  of diet, exercise and weight reduction.  Coralyn Helling, MD  Diplomat, American Board of Sleep Medicine  Electronically Signed     VS/MEDQ  D:  11/24/2007 12:58:36  T:  11/24/2007 13:18:17  Job:  [id number]

## 2010-09-20 NOTE — H&P (Signed)
[name], [name]                           ACCOUNT NO.:  [id number]   MEDICAL RECORD NO.:  [id number]                   PATIENT TYPE:  INP   LOCATION:  NA                                   FACILITY:  [facility]   PHYSICIAN:  Payton Doughty, M.D.                   DATE OF BIRTH:  24-Jul-1917   DATE OF ADMISSION:  11/08/2002  DATE OF DISCHARGE:                                HISTORY & PHYSICAL   ADMISSION DIAGNOSIS:  Herniated disk at L5-S1 on the left.   REASON FOR ADMISSION:  This is an 77 year old right handed white lady who in  1999 underwent an L2-3 diskectomy and did well.  She had a marked increase  in her back and left leg pain. An MRI showed a herniated disk at 5-1 on the  left and she is admitted now for lumbar diskectomy.   PAST MEDICAL HISTORY:  1. Lumbar fusion in December 1998 at L3-4 and L4-5 from which she did well.  2. Lumbar diskectomy in the past.  3. Hypertension.   PAST SURGICAL HISTORY:  1. Appendectomy.  2. Tonsillectomy.  3. Hysterectomy.  4. Cholecystectomy.   MEDICATIONS:  1. Zestril 10 mg a day.  2. Lasix 10 mg a day.  3. Synthroid 0.1 mg a day.   ALLERGIES:  PENICILLIN.   SOCIAL HISTORY:  She does not smoke and does not drink.  She is retired as a  Midwife.   FAMILY HISTORY:  Both parents are deceased with a history of diabetes and  hypertension.   REVIEW OF SYMPTOMS:  NEUROLOGIC:  Remarkable for back and left leg pain.   PHYSICAL EXAMINATION:  HEENT:  Within normal limits.  She has good range of  motion of her neck.  CHEST:  Clear.  CARDIAC EXAM:  Regular rate and rhythm.  ABDOMEN:  Non-tender without hepatosplenomegaly.  EXTREMITIES:  No clubbing or cyanosis.  GU EXAM:  Deferred.  PERIPHERAL:  Negative.  NEUROLOGIC:  She is awake, alert and oriented.  Cranial nerves are intact.  McMurray exam demonstrates 5/5 strength throughout the upper and lower  extremities.  She has a left S1 sensory deficit.  Reflexes are 1 at the  knees and absent at the ankles bilaterally.  Straight leg raise is positive  on the left.   MRI demonstrates a large herniated disk at 5-1 on the left.   CLINICAL IMPRESSION:  Left S1 radiculopathy secondary to herniated disk.   PLAN:  Lumbar laminectomy and diskectomy.  The risks and benefits of this  approach were discussed well and she wishes to proceed.                                               Payton Doughty, M.D.  MWR/MEDQ  D:  11/08/2002  T:  11/08/2002  Job:  [id number]

## 2010-09-20 NOTE — Op Note (Signed)
NAME:  [name], [name]                           ACCOUNT NO.:  [id number]   MEDICAL RECORD NO.:  [id number]                   PATIENT TYPE:  AMB   LOCATION:  DSC                                  FACILITY:  [facility]   PHYSICIAN:  Claude Manges. Cleophas Dunker, M.D.            DATE OF BIRTH:  [birth year]   DATE OF PROCEDURE:  12/28/2003  DATE OF DISCHARGE:                                 OPERATIVE REPORT   PREOPERATIVE DIAGNOSES:  1. Rotator cuff tear, right shoulder, with detachment.  2. Degenerative joint disease, acromioclavicular joint.   POSTOPERATIVE DIAGNOSES:  1. Rotator cuff tear, right shoulder, with detachment.  2. Degenerative joint disease, acromioclavicular joint.   PROCEDURES:  1. Arthroscopic debridement, right shoulder.  2. Arthroscopic subacromial decompression.  3. Arthroscopic distal clavicle resection.  4. Mini-open rotator cuff tear repair.   SURGEON:  Claude Manges. Cleophas Dunker, M.D.   ASSISTANT:  Legrand Pitts. Duffy, P.A.   ANESTHESIA:  General orotracheal.   COMPLICATIONS:  None.   HISTORY:  An 77 year old female, has been experiencing trouble with her  right shoulder for the last several months.  She was at a grocery store and  picked up an eight-pound ham and felt something happen in my shoulder.  Since that time she has had difficulty performing her activities of daily  living and difficulty raising her arm over her head.  X-rays revealed a type  2 acromion with moderate AC joint hypertrophy.  She has had an MRI scan  revealing a full-thickness partially-retracted tear of the supraspinatus  tendon with associated underlying supraspinatus and infraspinatus  tendinosis.  She is now to have an arthroscopic evaluation and open rotator  cuff tear repair.   PROCEDURE:  With the patient comfortable on the operating room table and  under general orotracheal anesthesia, the patient was placed in a  semisitting position using the shoulder frame.  The right shoulder was then  prepped  with Duraprep from the base of the neck circumferentially to below  the elbow.  Sterile draping was performed.   A marking pen was used to outline the acromion and the [facility] joint and the  coracoid.  At a point a fingerbreadth posterior and inferior the posterior  angle of the acromion, a small stab wound was made, prior to which point  0.25% Marcaine with epinephrine was injected.  The arthroscope was easily  placed into the shoulder joint.  Diagnostic arthroscopy revealed an obvious  rotator cuff tear.  There was fraying of the anterior glenoid and some  chondromalacia.  A second portal was established anteriorly and shaving of  the chondromalacia and entry of glenoid labrum was performed.  I could not  visualize the biceps tendon because of moderate debris at the tear.   The arthroscope was then placed in the subacromial space posteriorly and a  cannula in the subacromial space anteriorly.  An arthroscopic subacromial  decompression was performed using the  intra-articular __________, the Cuda  shaver, the ArthroCare wand, and the 6 mm bur.  There was obvious overhang  of the anterior acromion causing impingement.  A very nice decompression  post resection.   There was obvious chondromalacia of the AC joint.  A distal clavicle  resection was performed using the 6 mm bur with excellent decompression.   A mini open repair of the rotator cuff was then performed.  About an inch  incision was made at the junction of the anterior and lateral aspects of the  shoulder beginning at the acromion and coursing distally.  By sharp  dissection the incision was carried down to the subcutaneous tissue.  Then  via blunt dissection the deltoid fascia was incised and the fibers elevated.  A self-retaining retractor was inserted.  The rotator cuff tear was  obviously identified.  It was relatively extensive, approximately an inch in  length along the humeral head and extending posteriorly about an inch.   There was retraction of the fibers and thickening.  Sharp debridement of the  edges was performed with a 15 blade knife and a scissors.  A bony trough was  established with a rongeur.  Two Mitek anchors were inserted and then  supplemental 0 Ethibond suture was inserted to complete the rotator cuff  tear repair.  There was no evidence of impingement at that point with range  of motion.   The joint was irrigated with saline solution, the deltoid fascia closed with  a running 0 Vicryl, the subcu with 2-0 Vicryl, the skin closed with skin  clips. Marcaine 0.25% with epinephrine injected in the wound edges.  Sterile  bulky dressing was applied, followed by a sling.   PLAN:  Recovery care.  Discharge in a.m. on [facility].  Office, 10  days.                                               Claude Manges. Cleophas Dunker, M.D.    PWW/MEDQ  D:  12/28/2003  T:  12/28/2003  Job:  [id number]

## 2010-09-20 NOTE — Discharge Summary (Signed)
[name], [name]                 ACCOUNT NO.:  [id number]   MEDICAL RECORD NO.:  [id number]          PATIENT TYPE:  INP   LOCATION:  6703                         FACILITY:  [facility]   PHYSICIAN:  Hollice Espy, M.D.DATE OF BIRTH:  25-Jun-1917   DATE OF ADMISSION:  03/12/2006  DATE OF DISCHARGE:                           DISCHARGE SUMMARY - REFERRING   DISCHARGE MEDICATIONS:  1. Cipro 500 mg one p.o. b.i.d. x2 days.  2. Flagyl 500 mg one p.o. t.i.d. x2 days.  The patient will also resume all of her previous medications, these are as  follows: Synthroid, Lisinopril, Lasix 20 mg p.o. daily, Celexa, Caduet.   DISCHARGE DIAGNOSES:  1. Diverticulitis with resolving pericolic abscess.  2. Bilateral lower lobe atelectasis.  3.Protein malnutrition.  1. History of hypertension.  2. History of depression.  3. History of cholecystectomy.   HOSPITAL COURSE:  The patient is an 77 year old white female with a past  medical history of diverticulosis, who had been previously treated for  diverticulitis in the past. She presented with a 3-day history of left lower  quadrant pain. She had been seen by her PCP, Dr. Madison Hickman and a CT  scan showed an inflamed sigmoid colon with a 2.8 cm fluid collection which  appeared to be in a wall of the sigmoid colon. The patient was admitted to  the [facility], made n.p.o. and started on IV Cipro and  Flagyl. She presented with an elevated white count of 17, 600. The rest of  her labs were essentially unremarkable although her creatinine was slightly  elevated at 1.1. Dr. Claud Kelp, M.D. of [facility] was  consulted who noted the small abscess and after evaluation he felt that the  abscess was under control and well localized without any signs of  obstruction or perforation. He recommended conservative care, bowel rest and  intravenous antibiotics. Over the next several days the patient's labs  continued to be  followed. Her white count was slow to resolve but started to  show signs of improvement. A repeat CT scan done on March 16, 2006 showed  evidence of new bilateral pleural effusion but the study was done without  contrast and no discreet abscess was noted. Again, continued diverticulitis  was noted. The patient had a mild increase in her white count on March 17, 2006, slowly going up to 18,000. A chest x-ray was done to look for  other sources of infection and she was noted to have a consolidation at the  left base, questionable infiltrate versus atelectasis and some pulmonary  edema was noted as well.  The patient's IV fluids were eased off. Initially  her Lasix which she had been on as an outpatient was held secondary to  making her n.p.o. and hydrating her. Over the next several days patient  continued to show signs of improvement. She was continued on IV antibiotics  as well as starting on clear liquids.  By November 19 she was doing well,  her diet was advanced to full liquids which she tolerated well, and by  November 20 she  was advanced to a carbohydrate modified diet.  Given her  clinical improvement, a repeat CT scan done on March 20, 2006 which  showed improving diverticular disease and the previously noted  diverticulitis had decreased in size down to about 1.2 cm. At this point  patient was doing well, able to tolerate p.o. and was ambulating well. She  initially had to be put on TPN due to a prolonged abscess of food. An  albumin level was checked and noted to be low at around 2.2. The patient is  now able to tolerate p.o. and TPN was discontinued on March 24, 2006.   Once the patient is tolerating regular p.o. without incident, the plan will  be to discharge her home. She has already completed 12 days worth of  intravenous antibiotics and she will need two more for a full 14-day course.  Her overall condition from initial presentation is improved. Since she is   now ambulating well her activity will be as tolerated.   She will follow up with her PCP, Dr. Madison Hickman in the next 1-2 weeks  after the holiday weekend.   Her diet will be a low sodium diet and again she is advised to avoid food  such as nuts and foods with seeds in regards to her diverticular disease.   In regards to her atelectasis, repeat films showed no evidence of any type  of consolidation or concerns for pneumonia. This is felt to be more  atelectasis secondary to her initial bedridden status. An incentive  spirometer was provided for the patient. The rest of the patient's medical  issues remained stable during his hospitalization.      Hollice Espy, M.D.  Electronically Signed     SKK/MEDQ  D:  03/24/2006  T:  03/24/2006  Job:  [id number]   cc:   Darius Bump, M.D.  Angelia Mould. Derrell Lolling, M.D.

## 2010-09-20 NOTE — Consult Note (Signed)
[name], [name]                 ACCOUNT NO.:  [id number]   MEDICAL RECORD NO.:  [id number]          PATIENT TYPE:  INP   LOCATION:  6703                         FACILITY:  [facility]   PHYSICIAN:  Angelia Mould. Derrell Lolling, M.D.DATE OF BIRTH:  [birth year]   DATE OF CONSULTATION:  03/13/2006  DATE OF DISCHARGE:                                   CONSULTATION   REASON FOR CONSULTATION:  Evaluate diverticulitis.   HISTORY OF PRESENT ILLNESS:  This is an 77 year old, white female who states  that she has been treated as an outpatient for diverticulitis in the past,  but has never been hospitalized.  She states she had a colonoscopy by Dr.  Lina Sar within the last few years.   She now presents with a 3-day history of left lower quadrant abdominal pain,  malaise and she states she has been constipated and has vomited.  She saw  Dr. Madison Hickman yesterday.  A CT scan was obtained.  This shows inflamed  sigmoid colon with a 2.8 cm fluid collection which appears to be in the wall  of the sigmoid colon.  There is no free fluid.  There is no free air.  There  is no obstruction.  There is also a small density to the right of the  sigmoid colon which looks like a uterine fibroid, although the patient has  had a hysterectomy.  She was admitted by the [facility] hospitalists last night.  She was started on intravenous Cipro and Flagyl and has remained stable.   PAST MEDICAL HISTORY:  1. Hypertension.  2. Cholecystectomy.  3. Appendectomy.  4. Partial hysterectomy.  5. Colonoscopy within the last 2-4 years.   MEDICATIONS:  1. Synthroid.  2. Lisinopril.  3. Lasix.  4. Celexa.  5. Caduet.   ALLERGIES:  CODEINE AND PENICILLIN.   SOCIAL HISTORY:  She is married and has three grown children.  Denies  alcohol or tobacco.   FAMILY HISTORY:  Her brother had colon cancer.  Her sister had brain cancer.  Her other sister had some type of cancer involving the lymph nodes of her  neck.   REVIEW OF  SYSTEMS:  A 15-system review of systems is performed and is  noncontributory except as described above.   PHYSICAL EXAMINATION:  GENERAL:  Cooperative, somewhat disgruntled, elderly  white female in mild to moderate distress.  She is obese.  She does not  appear toxic or in any acute distress.  VITAL SIGNS:  Temperature 99.4, heart rate 66, respiratory 18, blood  pressure 108/55.  HEENT:  Sclerae clear.  Extraocular movements intact.  Nose, lips, tongue  and oropharynx without gross lesions.  NECK:  Supple, nontender.  No mass.  No adenopathy.  No jugular distension.  LUNGS:  Clear to auscultation.  No chest wall tenderness.  HEART:  Regular rate and rhythm.  Radial and femoral pulses are palpable.  ABDOMEN:  Obese, soft multiple scars, no hernias.  Liver and spleen not  enlarged.  No mass.  She is tender in the left lower quadrant with some  guarding, but this is very  localized and not very dramatic.  EXTREMITIES:  She moves all four extremities well without pain or deformity.  NEUROLOGIC:  No gross motor or sensory deficits.   LABORATORY DATA AND X-RAY FINDINGS:  CT scan described above.   Hemoglobin 9.1, white blood cell count 15,900.  Complete metabolic panel  unremarkable.   ASSESSMENT:  1. Acute sigmoid diverticulitis with tiny abscess, possibly intramural.      This appears under control and well-localized without signs of      obstruction or perforation and she is stable.  2. Anemia of uncertain etiology.  3. Hypertension.  4. Status post hysterectomy.  5. Status post appendectomy.  6. Status post cholecystectomy.   RECOMMENDATIONS:  1. I agree with admission to the hospital, bowel rest and broad-spectrum      antibiotics.  2. I would ask her gastroenterologist to see her to evaluate her from a      gastroenterological standpoint and to arrange outpatient followup and      management.  3. I see no indication for surgical intervention at this point.  4. I would repeat  the CT scan next week prior to discharge.      Angelia Mould. Derrell Lolling, M.D.  Electronically Signed     HMI/MEDQ  D:  03/13/2006  T:  03/13/2006  Job:  [id number]   cc:   Darius Bump, M.D.

## 2010-09-20 NOTE — Op Note (Signed)
[name], [name]                           ACCOUNT NO.:  [id number]   MEDICAL RECORD NO.:  [id number]                   PATIENT TYPE:  INP   LOCATION:  2891                                 FACILITY:  [facility]   PHYSICIAN:  Payton Doughty, M.D.                   DATE OF BIRTH:  [birth year]   DATE OF PROCEDURE:  02/14/2003  DATE OF DISCHARGE:                                 OPERATIVE REPORT   PREOPERATIVE DIAGNOSIS:  Recurrent herniated disk at L5-S1 on the left.   POSTOPERATIVE DIAGNOSIS:  Recurrent herniated disk at L5-S1 on the left.   PROCEDURE:  L5-S1 laminectomy, diskectomy, posterior lumbar interbody  fusion, with Ray threaded fusion cage.   SURGEON:  Payton Doughty, M.D.   ANESTHESIA:  General endotracheal.   PREPARATION:  Shaved, prepped and sterilely scrubbed with alcohol wipe.   COMPLICATIONS:  None.   PHYSICIAN ASSISTANT:  Hewitt Shorts, M.D.   NURSE ASSISTANT:  [facility].   INDICATIONS FOR PROCEDURE:  This is an 77 year old right-handed white lady  with a recurrent herniated disk at L5-S1 on the left.   DESCRIPTION OF PROCEDURE:  She is taken to the operating room and smoothly  anesthetized and intubated.  She is placed prone on the operating room  table.  Following a shave, prep and drape in the usual sterile fashion, the  skin is infiltrated with 1% lidocaine with 1:400,000 epinephrine.  The  distal 6.0 cm of her old incision was reopened.  The remaining lamina of L5  and the lamina of S1 were exposed bilaterally in a subperiosteal plane out  over the facet joints.  Intraoperative x-ray confirmed correctness of the  level.  The pars intra-articularis, remaining lamina, the inferior facet of  L5, and the superior facet of S1 were removed using the high-speed drill.  The ligament of flavum was removed to allow a complete dissection of both  the L5 and S1 nerve roots.  On the left side there is a very large recurrent  centrally-located bias towards the left  disk, which was removed without  difficulty.  The disk space was carefully explored and several other  fragments were removed.  The end plates were curetted.  Similarly on the  right side the disk was removed; however, there was no herniated disk to the  right; however, the right L5-S1 facet joint was significantly disrupted with  severe compression of the right L5 root.  Following the complete  decompression of both roots and removal of disk, the 12.0 mm x 21.0 mm Ray  threaded fusion cages were placed.  Intraoperative x-ray showed good  placement of the cages.  They were packed with bone graft harvested from the  facet joints.  Intraoperative x-ray showed good placement of the cages.  The  entire area was irrigated.  Hemostasis assured.  The fascia was  reapproximated with #0 Vicryl in  an interrupted fashion.  The subcutaneous  tissues were reapproximated with #0 Vicryl in an interrupted fashion.  The  subcuticular tissues were reapproximated with #3-0 Vicryl in an  interrupted fashion.  The skin was closed with #3-0 nylon in a running lock  fashion.  Betadine and Telfa dressing were applied and made occlusive with  Op-Site.  The patient returned to the recovery room in good condition.                                                Payton Doughty, M.D.    MWR/MEDQ  D:  02/14/2003  T:  02/14/2003  Job:  [id number]

## 2010-09-20 NOTE — Discharge Summary (Signed)
   [name], [name]                           ACCOUNT NO.:  [id number]   MEDICAL RECORD NO.:  [id number]                   PATIENT TYPE:  INP   LOCATION:  3015                                 FACILITY:  [facility]   PHYSICIAN:  Payton Doughty, M.D.                   DATE OF BIRTH:  03/11/1918   DATE OF ADMISSION:  02/14/2003  DATE OF DISCHARGE:  02/17/2003                                 DISCHARGE SUMMARY   ADMITTING DIAGNOSIS:  Recurrent herniated disk at 5-1.   DISCHARGE DIAGNOSIS:  Recurrent herniated disk at 5-1.   OPERATIVE PROCEDURE:  L5-S1 laminectomy and diskectomy and posterior lumbar  interbody fusion procedure with Ray cage.   SERVICE:  Neurosurgery.   COMPLICATIONS:  None.   DISCHARGE STATUS:  Alive and well.   BODY OF TEXT:  This is an 77 year old right-handed white lady whose history  and physical are recounted in the chart.  She has had prior fusions at 3-4  and 4-5.  She had a 2-3 disk.  She has had a 5-1 disk and has a disk  recurrence and is admitted for fusion.   MEDICAL HISTORY:  Medical history is remarkable for hypothyroidism.   MEDICATIONS:  Medications are Zestril, Lasix and Synthroid.   ALLERGIES:  Allergies are PENICILLIN and CODEINE.   PHYSICAL EXAMINATION:  GENERAL:  General exam was intact.  NEUROLOGIC:  Neurologic exam shows a left S1 radiculopathy.   HOSPITAL COURSE:  She was admitted after ascertainment of normal laboratory  values and underwent a 5-1 fusion with Ray cages.  Postoperatively, she did  well.  She was in the ICU for two days, mainly because of age and needing  help; she is now up without assist, eating and voiding normally.  Her  strength is full and incisions are dry.  She is being discharged home in the  care of her family.   FOLLOWUP:  Followup should be in the [facility]'  office in about 10 days for sutures.                                                Payton Doughty, M.D.    MWR/MEDQ  D:   02/17/2003  T:  02/17/2003  Job:  [id number]

## 2010-09-20 NOTE — H&P (Signed)
[name], [name]                           ACCOUNT NO.:  [id number]   MEDICAL RECORD NO.:  [id number]                   PATIENT TYPE:  INP   LOCATION:  2891                                 FACILITY:  [facility]   PHYSICIAN:  Payton Doughty, M.D.                   DATE OF BIRTH:  27-Sep-1917   DATE OF ADMISSION:  02/14/2003  DATE OF DISCHARGE:                                HISTORY & PHYSICAL   ADMISSION DIAGNOSES:  Recurrent herniated disk at L5-S1.   HISTORY:  This is an 77 year old right handed white lady who has undergone a  lumbar fusion in the past at L3-4 and L4-5. She underwent a 2-3 diskectomy  and has done well. She had a marked increase in back and left pain. In July,  MR showed a herniated disk at 5-1, and she underwent a diskectomy. Did well.  Now has had an increase in pain in her back and  MR that shows recurrent  disk at L5-S1 on the left. She has had episodes of both right and left back  pain but reports here today with a large recurrent herniated disk at L5-S1  and is admitted for lumbar laminectomy, diskectomy, and posterior lumbar  interbody fusion.   PAST MEDICAL HISTORY:  Otherwise remarkable for hypothyroidism.   MEDICATIONS:  1. Zestril 10 mg a day.  2. Lasix 10 mg a day.  3. Synthroid 0.01 mg a day.   ALLERGIES:  She is allergic to PENICILLIN and CODEINE.   PAST SURGICAL HISTORY:  1. Appendectomy.  2. Tonsillectomy.  3. Hysterectomy.  4. Cholecystectomy.   SOCIAL HISTORY:  She does not smoke and does not drink. She is a retired  Midwife.   FAMILY HISTORY:  Both parents are deceased with a history of diabetes and  hypertension.   REVIEW OF SYSTEMS:  Remarkable for back and left leg pain.   PHYSICAL EXAMINATION:  HEENT:  Within normal limits.  NECK:  She has good range of motion of her neck.  CHEST:  Clear.  CARDIAC:  Regular rate and rhythm.  ABDOMEN:  Nontender. No hepatosplenomegaly.  EXTREMITIES:  Without clubbing or cyanosis.  GENITOURINARY:  Deferred. Peripheral pulses are good.  NEUROLOGICAL:  She is awake, alert, and oriented. Cranial nerves are intact.  Motor exam demonstrates 5/5 strength throughout the upper and lower  extremities, save for the plantar flexors on the left side which were 5-/5.  She has a left S1 sensory deficit. Reflexes are 1 at the knees, absent at  the ankles bilaterally. Straight leg is positive on the left.   STUDIES:  MR shows a recurrent herniated disk on the left side at 5-1.   CLINICAL IMPRESSION:  Lumbar radiculopathy secondary to recurrent herniated  disk.   PLAN:  Lumbar laminectomy, diskectomy, posterior lumbar interbody fusion at  L5-S1. L2-3 has degenerative disk disease but does not demonstrate  compression pathology. The risks and benefits of this approach have been  discussed with her, and she wishes to proceed.                                                Payton Doughty, M.D.    MWR/MEDQ  D:  02/14/2003  T:  02/14/2003  Job:  [id number]

## 2010-09-20 NOTE — H&P (Signed)
[name], [name]                 ACCOUNT NO.:  [id number]   MEDICAL RECORD NO.:  [id number]          PATIENT TYPE:  INP   LOCATION:  6703                         FACILITY:  [facility]   PHYSICIAN:  Jackie Plum, M.D.DATE OF BIRTH:  04-22-1918   DATE OF ADMISSION:  03/12/2006  DATE OF DISCHARGE:                                HISTORY & PHYSICAL   CHIEF COMPLAINT:  Abdominal pain.   HISTORY OF PRESENT ILLNESS:  The patient presented with 3 days of worsening  abdominal pain.  She is an 77 year old lady with a history of diabetes,  hypertension.  She is status post appendectomy, cholecystectomy, and  hysterectomy.  Pain is said to be moderate in intensity without any known  aggravating or alleviating factors.  She has had fever, nausea and vomiting.  She does not have any diarrhea but has been constipated with a large bowel  movement about 3 days prior to presentation.  On presentation to ED, her  pain is said to be sharp in the right lower quadrants and right lumbar area.  The patient had a CT scan done which confirmed diverticulitis with secondary  abscess formation.  General surgery was consulted.  They requested medical  admission and for them to consult in the hospital.  The patient was  therefore, admitted for further evaluation and management in this regard.   PAST MEDICAL HISTORY:  As noted above.   ALLERGIES:  1. CODEINE.  2. PENICILLIN.   FAMILY HISTORY:  Insignificant in view of her advanced age.   SOCIAL HISTORY:  The patient does not smoke cigarettes or drink alcohol.  She is usually active and independent in activities of daily living.   PHYSICAL EXAMINATION:  VITAL SIGNS:  BP 127/56, pulse 72, respirations 22,  temp 100.8 degrees Fahrenheit.  GENERAL:  The patient was not in acute cardiopulmonary distress.  HEENT:  Pupils are equal, round, and reactive to light.  Extraocular  movements intact.  Oropharynx moist.  NECK:  Supple.  No JVD.  LUNGS:  Decreased  breath sounds without any crackles or wheezes noted.  CARDIAC:  Regular.  No gallop noted.  ABDOMEN:  Obese, soft.  Bowel sounds present.  The patient had tenderness in  the right lower quadrant area without any guarding or rebound tenderness.  NEUROLOGIC:  The patient is alert and appropriate.  No acute focal deficit.   LABORATORY:  CT scan of the abdomen is said to have shown sigmoid  diverticulitis with abscess.  CBC:  WBC count 17.6, hemoglobin 10.9,  hematocrit 3.3, platelet count 261.  Sodium 138, potassium 4.2, chloride  102, CO2 28, glucose 177, BUN 18, creatinine 1.1.  Bilirubin  0.6, alkaline  phosphatase 8, AST 26, ALT 20, total protein 6.6, albumin 3.0, calcium 8.7.  UA was noted without any evidence of pyuria.   IMPRESSION:  Diverticulitis with diverticular abscess.   The patient is admitted for IV antibiotics and other supportive care,  treatment with antiemetics and antinauseants.  The patient is to be seen in  the morning by [facility] for possible surgical  intervention as deemed  appropriate.  We will follow up the patient's  leukocytosis.      Jackie Plum, M.D.  Electronically Signed     GO/MEDQ  D:  03/13/2006  T:  03/13/2006  Job:  [id number]

## 2010-09-20 NOTE — Op Note (Signed)
[name], Claire                           ACCOUNT NO.:  [id number]   MEDICAL RECORD NO.:  [id number]                   PATIENT TYPE:  INP   LOCATION:  NA                                   FACILITY:  [facility]   PHYSICIAN:  Payton Doughty, M.D.                   DATE OF BIRTH:  12-Jan-1918   DATE OF PROCEDURE:  11/08/2002  DATE OF DISCHARGE:                                 OPERATIVE REPORT   PREOPERATIVE DIAGNOSIS:  Herniated disk L5-S1 left.   POSTOPERATIVE DIAGNOSIS:  Herniated disk L5-S1 left.   OPERATION PERFORMED:  L5-S1 laminectomy diskectomy, left.   SURGEON:  Payton Doughty, M.D.   ASSISTANT:  1. Hilda Lias, M.D.  2. [place].   ANESTHESIA:  General endotracheal.   PREP:  Sterile Betadine prep and scrub with alcohol wipe.   COMPLICATIONS:  None.   INDICATIONS FOR PROCEDURE:  The patient is an 77 year old right-handed white  lady with a herniated disk at L5-1 and a left S1 radiculopathy.   DESCRIPTION OF PROCEDURE:  The patient was taken to the operating room,  smoothly anesthetized, intubated and placed  prone on the operating table.  Following shave, prep and drape in the usual sterile fashion, the skin was  infiltrated with 1% lidocaine 1:400,000 epinephrine.  The skin was incised  from midL5 to midS1 and the lamina of L5 with exposure on the left side in  the subperiosteal plane.  Intraoperative x-ray confirmed correctness of  level.  Having confirmed correctness of the level, hemisemilaminectomy of L5  was carried out to the top of the ligamentum flavum.  It was removed in a  retrograde fashion. Lateral dissection was carried out to uncover the left  S1 nerve root.  It was dissected free, carefully deflected medially.  The  disk space was entered.  Graspable fragments of disk were removed and then  exploration of the neural foramen on the left side revealed a large free  fragment of disk that was removed without difficulty.  The neural foramen  and the nerve  root were carefully explored and found to free.  The wound was  irrigated and hemostasis assured.  The laminectomy defect was filled with  Depo-Medrol soaked fat.  The fascia was then reapproximated with 0 Vicryl in  interrupted fashion.  Subcutaneous tissues were  reapproximated with 0 Vicryl and subcuticular tissues reapproximated with 3-  0 Vicryl in interrupted fashion.  The skin was closed with 3-0 nylon in a  running locked fashion.  Betadine and Telfa dressing were applied and made  occlusive with Op-Site.  Then the patient returned to recovery room in good  condition.  Payton Doughty, M.D.    MWR/MEDQ  D:  11/08/2002  T:  11/08/2002  Job:  [id number]

## 2010-12-13 ENCOUNTER — Other Ambulatory Visit: Payer: Self-pay | Admitting: Family Medicine

## 2011-01-02 ENCOUNTER — Inpatient Hospital Stay (HOSPITAL_COMMUNITY)
Admission: EM | Admit: 2011-01-02 | Discharge: 2011-01-17 | DRG: 391 | Disposition: A | Discharge status: Skilled Nursing Facility | Payer: Medicare Other | Attending: Internal Medicine | Admitting: Internal Medicine

## 2011-01-02 ENCOUNTER — Emergency Department (HOSPITAL_COMMUNITY): Payer: Medicare Other

## 2011-01-02 DIAGNOSIS — Z79899 Other long term (current) drug therapy: Secondary | ICD-10-CM

## 2011-01-02 DIAGNOSIS — E119 Type 2 diabetes mellitus without complications: Secondary | ICD-10-CM | POA: Diagnosis present

## 2011-01-02 DIAGNOSIS — K5732 Diverticulitis of large intestine without perforation or abscess without bleeding: Principal | ICD-10-CM | POA: Diagnosis present

## 2011-01-02 DIAGNOSIS — Z880 Allergy status to penicillin: Secondary | ICD-10-CM

## 2011-01-02 DIAGNOSIS — F068 Other specified mental disorders due to known physiological condition: Secondary | ICD-10-CM | POA: Diagnosis present

## 2011-01-02 DIAGNOSIS — N390 Urinary tract infection, site not specified: Secondary | ICD-10-CM | POA: Diagnosis present

## 2011-01-02 DIAGNOSIS — F329 Major depressive disorder, single episode, unspecified: Secondary | ICD-10-CM | POA: Diagnosis present

## 2011-01-02 DIAGNOSIS — K630 Abscess of intestine: Secondary | ICD-10-CM | POA: Diagnosis present

## 2011-01-02 DIAGNOSIS — D72829 Elevated white blood cell count, unspecified: Secondary | ICD-10-CM | POA: Diagnosis present

## 2011-01-02 DIAGNOSIS — D649 Anemia, unspecified: Secondary | ICD-10-CM | POA: Diagnosis present

## 2011-01-02 DIAGNOSIS — R5381 Other malaise: Secondary | ICD-10-CM | POA: Diagnosis present

## 2011-01-02 DIAGNOSIS — B964 Proteus (mirabilis) (morganii) as the cause of diseases classified elsewhere: Secondary | ICD-10-CM | POA: Diagnosis present

## 2011-01-02 DIAGNOSIS — I129 Hypertensive chronic kidney disease with stage 1 through stage 4 chronic kidney disease, or unspecified chronic kidney disease: Secondary | ICD-10-CM | POA: Diagnosis present

## 2011-01-02 DIAGNOSIS — F3289 Other specified depressive episodes: Secondary | ICD-10-CM | POA: Diagnosis present

## 2011-01-02 DIAGNOSIS — E039 Hypothyroidism, unspecified: Secondary | ICD-10-CM | POA: Diagnosis present

## 2011-01-02 DIAGNOSIS — G929 Unspecified toxic encephalopathy: Secondary | ICD-10-CM | POA: Diagnosis present

## 2011-01-02 DIAGNOSIS — K649 Unspecified hemorrhoids: Secondary | ICD-10-CM | POA: Diagnosis present

## 2011-01-02 DIAGNOSIS — E785 Hyperlipidemia, unspecified: Secondary | ICD-10-CM | POA: Diagnosis present

## 2011-01-02 DIAGNOSIS — G92 Toxic encephalopathy: Secondary | ICD-10-CM | POA: Diagnosis present

## 2011-01-02 DIAGNOSIS — E669 Obesity, unspecified: Secondary | ICD-10-CM | POA: Diagnosis present

## 2011-01-02 DIAGNOSIS — J9610 Chronic respiratory failure, unspecified whether with hypoxia or hypercapnia: Secondary | ICD-10-CM | POA: Diagnosis present

## 2011-01-02 DIAGNOSIS — I2510 Atherosclerotic heart disease of native coronary artery without angina pectoris: Secondary | ICD-10-CM | POA: Diagnosis present

## 2011-01-02 DIAGNOSIS — N183 Chronic kidney disease, stage 3 unspecified: Secondary | ICD-10-CM | POA: Diagnosis present

## 2011-01-02 LAB — URINE MICROSCOPIC-ADD ON

## 2011-01-02 LAB — LACTIC ACID, PLASMA: Lactic Acid, Venous: 1 mmol/L (ref 0.5–2.2)

## 2011-01-02 LAB — BASIC METABOLIC PANEL
CO2: 34 mEq/L — ABNORMAL HIGH (ref 19–32)
Chloride: 96 mEq/L (ref 96–112)
Creatinine, Ser: 1.07 mg/dL (ref 0.50–1.10)
GFR calc Af Amer: 58 mL/min — ABNORMAL LOW (ref >60)
Potassium: 4 mEq/L (ref 3.5–5.1)

## 2011-01-02 LAB — CBC
HCT: 30.2 % — ABNORMAL LOW (ref 36.0–46.0)
MCV: 85.1 fL (ref 78.0–100.0)
Platelets: 319 10*3/uL (ref 150–400)
RBC: 3.55 MIL/uL — ABNORMAL LOW (ref 3.87–5.11)
RDW: 14.2 % (ref 11.5–15.5)
WBC: 13.7 10*3/uL — ABNORMAL HIGH (ref 4.0–10.5)

## 2011-01-02 LAB — DIFFERENTIAL
Basophils Relative: 1 % (ref 0–1)
Lymphocytes Relative: 10 % — ABNORMAL LOW (ref 12–46)
Monocytes Absolute: 1 10*3/uL (ref 0.1–1.0)
Monocytes Relative: 7 % (ref 3–12)
Neutro Abs: 11 10*3/uL — ABNORMAL HIGH (ref 1.7–7.7)
Neutrophils Relative %: 80 % — ABNORMAL HIGH (ref 43–77)

## 2011-01-02 LAB — URINALYSIS, ROUTINE W REFLEX MICROSCOPIC
Hgb urine dipstick: NEGATIVE
Nitrite: NEGATIVE
Protein, ur: NEGATIVE mg/dL
Specific Gravity, Urine: 1.021 (ref 1.005–1.030)
Urobilinogen, UA: 0.2 mg/dL (ref 0.0–1.0)

## 2011-01-03 LAB — BASIC METABOLIC PANEL
Calcium: 8.9 mg/dL (ref 8.4–10.5)
GFR calc non Af Amer: 45 mL/min — ABNORMAL LOW (ref >60)
Glucose, Bld: 142 mg/dL — ABNORMAL HIGH (ref 70–99)
Sodium: 138 mEq/L (ref 135–145)

## 2011-01-03 LAB — URINE CULTURE: Culture  Setup Time: 201208310142

## 2011-01-03 LAB — CK TOTAL AND CKMB (NOT AT ARMC)
CK, MB: 0.9 ng/mL (ref 0.3–4.0)
Relative Index: INVALID (ref 0.0–2.5)

## 2011-01-03 LAB — GLUCOSE, CAPILLARY: Glucose-Capillary: 142 mg/dL — ABNORMAL HIGH (ref 70–99)

## 2011-01-03 LAB — CBC
MCH: 26.1 pg (ref 26.0–34.0)
MCHC: 30.3 g/dL (ref 30.0–36.0)
Platelets: 320 10*3/uL (ref 150–400)

## 2011-01-03 NOTE — H&P (Signed)
[name], [name]                 ACCOUNT NO.:  [id number]  MEDICAL RECORD NO.:  [id number]  LOCATION:  WLED                         FACILITY:  [facility]  PHYSICIAN:  Gery Pray, MD      DATE OF BIRTH:  [birth year]  DATE OF ADMISSION:  01/02/2011 DATE OF DISCHARGE:                             HISTORY & PHYSICAL   PRIMARY CARE PHYSICIAN:  Kandyce Rud, MD, at [facility].  CODE STATUS:  DNR.  The patient goes to team 5.  CHIEF COMPLAINT:  Weakness.  HISTORY OF PRESENT ILLNESS:  This is a 77 year old female who lives at home with her son.  She was diagnosed with a UTI approximately a week ago.  She was initially treated with Cipro 4-day course.  She continued to be weak, and was restarted on Cipro again this past Monday for a 10-day course.  In the interim, the patient has continued to get weaker. Her mentation became more altered.  She at times seemed unresponsive to the family.  She usually walks with a walker and her gait has gotten more unsteady.  She now has problems even going from a sitting to standing position and walking to bathroom.  She does have a fever which is low grade.  No nausea.  No vomiting.  No chills.  There is some complaint of abdominal pain in the left lower quadrant, no complaints of burning urination.  Today when they were trying to get her to the restroom, she started shaking and became a little bit dyspneic, therefore they decided to take her to the ER.  History obtained from the patient's daughter and son who appear reliable.  REVIEW OF SYSTEMS:  As best able to through family and patient.  All 10- point systems reviewed are negative except as noted in HPI.  PAST MEDICAL HISTORY: 1. CAD. 2. Diabetes mellitus. 3. Hypertension. 4. Hypothyroidism. 5. Depression. 6. Diverticulosis. 7. History of hemorrhoids. 8. Chronic respiratory failure.  PAST SURGICAL HISTORY:  Appendectomy, hysterectomy, 4 back surgeries, tonsil and adenoid,  cholecystectomy, cataract surgery.  MEDICATIONS:  Multivitamin, simvastatin, Lasix, melatonin, Cipro, glyburide, Norvasc, levothyroxine, citalopram, and Flagyl.  ALLERGIES:  The patient is allergic to CODEINE and PENICILLIN both of which causes a rash.  FAMILY HISTORY:  Significant for cancers colon, brain, throat.  PHYSICAL EXAMINATION:  VITAL SIGNS:  Blood pressure 138/47, pulse 67, respirations 16, temperature 100.3, saturating 99% on room air. GENERAL:  Alert, oriented x2 female, pleasant, weak appearing. EYES:  Pink conjunctivae.  PERRLA. ENT:  Moist oral mucosa.  Trachea midline. NECK:  Supple. LUNGS:  Clear to auscultation bilaterally.  No use of accessory muscles. CARDIOVASCULAR:  Regular rate and rhythm without murmurs, regurg, or gallops.  No JVD. ABDOMEN:  Soft, positive bowel sounds, nontender, nondistended.  No organomegaly. NEUROLOGIC:  Cranial nerves II through XII grossly intact.  Sensation intact. MUSCULOSKELETAL:  Strength 5/5 in all extremities.  No clubbing, cyanosis, or edema. SKIN:  No rashes.  No subcutaneous crepitation.  LABORATORY DATA:  UA, the patient has no nitrites, small leukocyte esterase, she has many bacteria, no white blood count reading for the UA.  BNP 1327.  Lactic acid 1.0.  Chest x-ray, cardiomegaly  with mild pulmonary edema pattern.  Sodium 135, potassium 4, chloride 96, CO2 of 34, glucose 208, BUN 19, creatinine 1.07.  ASSESSMENT AND PLAN: 1. Sepsis due to urinary tract infection. 2. Encephalopathic due to urinary tract infection. 3. Weakness - deconditioning due to urinary tract infection.  The     patient will be brought in for failed outpatient regimen.  Urine     culture, blood cultures will be collected.  We will continue the     patient on IV Rocephin.  We will consult Physical Therapy. 4. Diabetes mellitus, currently poor control.  Will resume home     medications, place the patient on ADA diet and monitor. 5.  Hypertension. 6. Hypothyroidism. 7. Depression. 8. Hemorrhoids. 9. Chronic respiratory failure, all stable.  Continue home medications     including an oxygen.          ______________________________ Gery Pray, MD     DC/MEDQ  D:  01/03/2011  T:  01/03/2011  Job:  [id number]  Electronically Signed by Gery Pray MD on 01/03/2011 04:43:39 AM

## 2011-01-04 ENCOUNTER — Inpatient Hospital Stay (HOSPITAL_COMMUNITY): Payer: Medicare Other

## 2011-01-04 LAB — DIFFERENTIAL
Basophils Absolute: 0.1 10*3/uL (ref 0.0–0.1)
Lymphocytes Relative: 12 % (ref 12–46)
Lymphs Abs: 1.6 10*3/uL (ref 0.7–4.0)
Neutro Abs: 10.3 10*3/uL — ABNORMAL HIGH (ref 1.7–7.7)
Neutrophils Relative %: 78 % — ABNORMAL HIGH (ref 43–77)

## 2011-01-04 LAB — CBC
HCT: 31.8 % — ABNORMAL LOW (ref 36.0–46.0)
MCV: 86.2 fL (ref 78.0–100.0)
RBC: 3.69 MIL/uL — ABNORMAL LOW (ref 3.87–5.11)
WBC: 13.4 10*3/uL — ABNORMAL HIGH (ref 4.0–10.5)

## 2011-01-04 LAB — GLUCOSE, CAPILLARY: Glucose-Capillary: 207 mg/dL — ABNORMAL HIGH (ref 70–99)

## 2011-01-04 LAB — BASIC METABOLIC PANEL
BUN: 21 mg/dL (ref 6–23)
CO2: 34 mEq/L — ABNORMAL HIGH (ref 19–32)
GFR calc non Af Amer: 43 mL/min — ABNORMAL LOW (ref >60)
Glucose, Bld: 113 mg/dL — ABNORMAL HIGH (ref 70–99)
Potassium: 4.4 mEq/L (ref 3.5–5.1)

## 2011-01-04 LAB — APTT: aPTT: 43 seconds — ABNORMAL HIGH (ref 24–37)

## 2011-01-04 MED ORDER — IOHEXOL 300 MG/ML  SOLN
100.0000 mL | Freq: Once | INTRAMUSCULAR | Status: AC | PRN
  Administered 2011-01-04: 100 mL via INTRAVENOUS

## 2011-01-05 ENCOUNTER — Inpatient Hospital Stay (HOSPITAL_COMMUNITY): Payer: Medicare Other

## 2011-01-05 DIAGNOSIS — K5732 Diverticulitis of large intestine without perforation or abscess without bleeding: Secondary | ICD-10-CM

## 2011-01-05 DIAGNOSIS — K630 Abscess of intestine: Secondary | ICD-10-CM

## 2011-01-05 LAB — CBC
HCT: 30.5 % — ABNORMAL LOW (ref 36.0–46.0)
MCV: 86.6 fL (ref 78.0–100.0)
Platelets: 379 10*3/uL (ref 150–400)
RBC: 3.52 MIL/uL — ABNORMAL LOW (ref 3.87–5.11)
WBC: 13.7 10*3/uL — ABNORMAL HIGH (ref 4.0–10.5)

## 2011-01-05 LAB — BASIC METABOLIC PANEL
CO2: 33 mEq/L — ABNORMAL HIGH (ref 19–32)
Chloride: 94 mEq/L — ABNORMAL LOW (ref 96–112)
Creatinine, Ser: 1.2 mg/dL — ABNORMAL HIGH (ref 0.50–1.10)
GFR calc Af Amer: 51 mL/min — ABNORMAL LOW (ref >60)
Potassium: 3.9 mEq/L (ref 3.5–5.1)

## 2011-01-05 LAB — GLUCOSE, CAPILLARY
Glucose-Capillary: 140 mg/dL — ABNORMAL HIGH (ref 70–99)
Glucose-Capillary: 185 mg/dL — ABNORMAL HIGH (ref 70–99)
Glucose-Capillary: 220 mg/dL — ABNORMAL HIGH (ref 70–99)

## 2011-01-06 LAB — CBC
HCT: 30.1 % — ABNORMAL LOW (ref 36.0–46.0)
Hemoglobin: 9.1 g/dL — ABNORMAL LOW (ref 12.0–15.0)
MCH: 26.2 pg (ref 26.0–34.0)
MCHC: 30.2 g/dL (ref 30.0–36.0)
MCV: 86.7 fL (ref 78.0–100.0)
RDW: 14.6 % (ref 11.5–15.5)

## 2011-01-06 LAB — BASIC METABOLIC PANEL
BUN: 20 mg/dL (ref 6–23)
CO2: 29 mEq/L (ref 19–32)
Chloride: 94 mEq/L — ABNORMAL LOW (ref 96–112)
Creatinine, Ser: 1.27 mg/dL — ABNORMAL HIGH (ref 0.50–1.10)
GFR calc Af Amer: 48 mL/min — ABNORMAL LOW (ref >60)
Glucose, Bld: 128 mg/dL — ABNORMAL HIGH (ref 70–99)
Potassium: 5.1 mEq/L (ref 3.5–5.1)

## 2011-01-06 LAB — GLUCOSE, CAPILLARY
Glucose-Capillary: 153 mg/dL — ABNORMAL HIGH (ref 70–99)
Glucose-Capillary: 221 mg/dL — ABNORMAL HIGH (ref 70–99)
Glucose-Capillary: 204 mg/dL — ABNORMAL HIGH (ref 70–99)

## 2011-01-07 LAB — BASIC METABOLIC PANEL
BUN: 18 mg/dL (ref 6–23)
CO2: 35 mEq/L — ABNORMAL HIGH (ref 19–32)
Calcium: 8.6 mg/dL (ref 8.4–10.5)
Glucose, Bld: 148 mg/dL — ABNORMAL HIGH (ref 70–99)
Potassium: 3.8 mEq/L (ref 3.5–5.1)
Sodium: 135 mEq/L (ref 135–145)

## 2011-01-07 LAB — CBC
HCT: 31.4 % — ABNORMAL LOW (ref 36.0–46.0)
Hemoglobin: 9.4 g/dL — ABNORMAL LOW (ref 12.0–15.0)
MCH: 25.8 pg — ABNORMAL LOW (ref 26.0–34.0)
MCHC: 29.9 g/dL — ABNORMAL LOW (ref 30.0–36.0)
RBC: 3.64 MIL/uL — ABNORMAL LOW (ref 3.87–5.11)

## 2011-01-07 LAB — GLUCOSE, CAPILLARY
Glucose-Capillary: 213 mg/dL — ABNORMAL HIGH (ref 70–99)
Glucose-Capillary: 186 mg/dL — ABNORMAL HIGH (ref 70–99)

## 2011-01-08 ENCOUNTER — Inpatient Hospital Stay (HOSPITAL_COMMUNITY): Payer: Medicare Other

## 2011-01-08 LAB — CULTURE, ROUTINE-ABSCESS

## 2011-01-08 LAB — GLUCOSE, CAPILLARY
Glucose-Capillary: 146 mg/dL — ABNORMAL HIGH (ref 70–99)
Glucose-Capillary: 383 mg/dL — ABNORMAL HIGH (ref 70–99)
Glucose-Capillary: 228 mg/dL — ABNORMAL HIGH (ref 70–99)
Glucose-Capillary: 225 mg/dL — ABNORMAL HIGH (ref 70–99)

## 2011-01-08 LAB — CBC
HCT: 31.5 % — ABNORMAL LOW (ref 36.0–46.0)
Hemoglobin: 9.5 g/dL — ABNORMAL LOW (ref 12.0–15.0)
MCH: 26.1 pg (ref 26.0–34.0)
MCHC: 30.2 g/dL (ref 30.0–36.0)
MCV: 86.5 fL (ref 78.0–100.0)
Platelets: 395 10*3/uL (ref 150–400)
RBC: 3.64 MIL/uL — ABNORMAL LOW (ref 3.87–5.11)
RDW: 14.6 % (ref 11.5–15.5)
WBC: 12.2 10*3/uL — ABNORMAL HIGH (ref 4.0–10.5)

## 2011-01-08 LAB — BASIC METABOLIC PANEL
CO2: 34 mEq/L — ABNORMAL HIGH (ref 19–32)
Chloride: 96 mEq/L (ref 96–112)
Sodium: 135 mEq/L (ref 135–145)

## 2011-01-09 ENCOUNTER — Inpatient Hospital Stay (HOSPITAL_COMMUNITY): Payer: Medicare Other

## 2011-01-09 LAB — CULTURE, BLOOD (ROUTINE X 2)
Culture  Setup Time: 201208310845
Culture: NO GROWTH

## 2011-01-09 LAB — BASIC METABOLIC PANEL
BUN: 21 mg/dL (ref 6–23)
Chloride: 95 mEq/L — ABNORMAL LOW (ref 96–112)
Creatinine, Ser: 1.14 mg/dL — ABNORMAL HIGH (ref 0.50–1.10)
GFR calc Af Amer: 54 mL/min — ABNORMAL LOW (ref >60)
GFR calc non Af Amer: 45 mL/min — ABNORMAL LOW (ref >60)
Glucose, Bld: 148 mg/dL — ABNORMAL HIGH (ref 70–99)

## 2011-01-09 LAB — DIFFERENTIAL
Eosinophils Relative: 3 % (ref 0–5)
Lymphocytes Relative: 16 % (ref 12–46)
Lymphs Abs: 2 10*3/uL (ref 0.7–4.0)
Monocytes Absolute: 1.1 10*3/uL — ABNORMAL HIGH (ref 0.1–1.0)

## 2011-01-09 LAB — CBC
HCT: 32.5 % — ABNORMAL LOW (ref 36.0–46.0)
MCHC: 29.8 g/dL — ABNORMAL LOW (ref 30.0–36.0)
MCV: 87.1 fL (ref 78.0–100.0)
RDW: 14.7 % (ref 11.5–15.5)

## 2011-01-09 LAB — GLUCOSE, CAPILLARY
Glucose-Capillary: 251 mg/dL — ABNORMAL HIGH (ref 70–99)
Glucose-Capillary: 187 mg/dL — ABNORMAL HIGH (ref 70–99)

## 2011-01-10 LAB — BASIC METABOLIC PANEL
Calcium: 8.9 mg/dL (ref 8.4–10.5)
Creatinine, Ser: 1.08 mg/dL (ref 0.50–1.10)
GFR calc Af Amer: 58 mL/min — ABNORMAL LOW (ref >60)
GFR calc non Af Amer: 48 mL/min — ABNORMAL LOW (ref >60)
Sodium: 134 mEq/L — ABNORMAL LOW (ref 135–145)

## 2011-01-10 LAB — DIFFERENTIAL
Basophils Relative: 1 % (ref 0–1)
Eosinophils Absolute: 0.4 10*3/uL (ref 0.0–0.7)
Eosinophils Relative: 4 % (ref 0–5)
Monocytes Relative: 8 % (ref 3–12)
Neutrophils Relative %: 71 % (ref 43–77)

## 2011-01-10 LAB — CBC
MCH: 26.2 pg (ref 26.0–34.0)
MCHC: 30.4 g/dL (ref 30.0–36.0)
MCV: 86.5 fL (ref 78.0–100.0)
Platelets: 389 10*3/uL (ref 150–400)
RBC: 3.62 MIL/uL — ABNORMAL LOW (ref 3.87–5.11)
RDW: 14.8 % (ref 11.5–15.5)

## 2011-01-10 LAB — GLUCOSE, CAPILLARY: Glucose-Capillary: 150 mg/dL — ABNORMAL HIGH (ref 70–99)

## 2011-01-11 LAB — BASIC METABOLIC PANEL
BUN: 19 mg/dL (ref 6–23)
Calcium: 8.8 mg/dL (ref 8.4–10.5)
Creatinine, Ser: 1.14 mg/dL — ABNORMAL HIGH (ref 0.50–1.10)
GFR calc Af Amer: 54 mL/min — ABNORMAL LOW (ref >60)

## 2011-01-11 LAB — DIFFERENTIAL
Basophils Relative: 1 % (ref 0–1)
Lymphocytes Relative: 17 % (ref 12–46)
Monocytes Absolute: 1.1 10*3/uL — ABNORMAL HIGH (ref 0.1–1.0)
Monocytes Relative: 9 % (ref 3–12)
Neutro Abs: 8.1 10*3/uL — ABNORMAL HIGH (ref 1.7–7.7)
Neutrophils Relative %: 69 % (ref 43–77)

## 2011-01-11 LAB — CBC
MCH: 25.8 pg — ABNORMAL LOW (ref 26.0–34.0)
MCHC: 29.4 g/dL — ABNORMAL LOW (ref 30.0–36.0)
MCV: 87.5 fL (ref 78.0–100.0)
Platelets: 381 10*3/uL (ref 150–400)
RDW: 14.8 % (ref 11.5–15.5)

## 2011-01-11 LAB — GLUCOSE, CAPILLARY
Glucose-Capillary: 143 mg/dL — ABNORMAL HIGH (ref 70–99)
Glucose-Capillary: 218 mg/dL — ABNORMAL HIGH (ref 70–99)

## 2011-01-12 LAB — CBC
HCT: 31.8 % — ABNORMAL LOW (ref 36.0–46.0)
MCV: 86.2 fL (ref 78.0–100.0)
Platelets: 359 10*3/uL (ref 150–400)
RBC: 3.69 MIL/uL — ABNORMAL LOW (ref 3.87–5.11)
RDW: 14.8 % (ref 11.5–15.5)
WBC: 11.7 10*3/uL — ABNORMAL HIGH (ref 4.0–10.5)

## 2011-01-12 LAB — DIFFERENTIAL
Basophils Absolute: 0.1 10*3/uL (ref 0.0–0.1)
Eosinophils Absolute: 0.4 10*3/uL (ref 0.0–0.7)
Lymphocytes Relative: 14 % (ref 12–46)
Lymphs Abs: 1.6 10*3/uL (ref 0.7–4.0)
Neutrophils Relative %: 75 % (ref 43–77)

## 2011-01-12 LAB — GLUCOSE, CAPILLARY
Glucose-Capillary: 166 mg/dL — ABNORMAL HIGH (ref 70–99)
Glucose-Capillary: 243 mg/dL — ABNORMAL HIGH (ref 70–99)

## 2011-01-13 LAB — BASIC METABOLIC PANEL
BUN: 20 mg/dL (ref 6–23)
Calcium: 9 mg/dL (ref 8.4–10.5)
Creatinine, Ser: 1.2 mg/dL — ABNORMAL HIGH (ref 0.50–1.10)
GFR calc Af Amer: 51 mL/min — ABNORMAL LOW (ref >60)
GFR calc non Af Amer: 42 mL/min — ABNORMAL LOW (ref >60)
Glucose, Bld: 134 mg/dL — ABNORMAL HIGH (ref 70–99)
Potassium: 4.1 mEq/L (ref 3.5–5.1)

## 2011-01-13 LAB — GLUCOSE, CAPILLARY
Glucose-Capillary: 195 mg/dL — ABNORMAL HIGH (ref 70–99)
Glucose-Capillary: 168 mg/dL — ABNORMAL HIGH (ref 70–99)

## 2011-01-13 LAB — CBC
HCT: 32.1 % — ABNORMAL LOW (ref 36.0–46.0)
Hemoglobin: 9.4 g/dL — ABNORMAL LOW (ref 12.0–15.0)
MCH: 25.7 pg — ABNORMAL LOW (ref 26.0–34.0)
MCHC: 29.3 g/dL — ABNORMAL LOW (ref 30.0–36.0)
RDW: 14.9 % (ref 11.5–15.5)

## 2011-01-14 LAB — BASIC METABOLIC PANEL
CO2: 35 mEq/L — ABNORMAL HIGH (ref 19–32)
Chloride: 99 mEq/L (ref 96–112)
Creatinine, Ser: 1.15 mg/dL — ABNORMAL HIGH (ref 0.50–1.10)
GFR calc Af Amer: 54 mL/min — ABNORMAL LOW (ref >60)
Sodium: 137 mEq/L (ref 135–145)

## 2011-01-14 LAB — CBC
MCV: 87.6 fL (ref 78.0–100.0)
Platelets: 373 10*3/uL (ref 150–400)
RBC: 3.64 MIL/uL — ABNORMAL LOW (ref 3.87–5.11)
RDW: 14.9 % (ref 11.5–15.5)
WBC: 10.8 10*3/uL — ABNORMAL HIGH (ref 4.0–10.5)

## 2011-01-14 LAB — GLUCOSE, CAPILLARY
Glucose-Capillary: 146 mg/dL — ABNORMAL HIGH (ref 70–99)
Glucose-Capillary: 173 mg/dL — ABNORMAL HIGH (ref 70–99)

## 2011-01-14 NOTE — Progress Notes (Signed)
[name], [name]                 ACCOUNT NO.:  [id number]  MEDICAL RECORD NO.:  [id number]  LOCATION:  1334                         FACILITY:  [facility]  PHYSICIAN:  Erick Blinks, MD     DATE OF BIRTH:  [birth year]                                PROGRESS NOTE   CURRENT DIAGNOSES LIST: 1. Acute diverticulitis with underlying abscess, status post placement     of CT-guided drainage catheter. 2. Dementia. 3. Leukocytosis. 4. Encephalopathy, resolved, back at baseline. 5. Dehydration, resolved. 6. Coronary artery disease. 7. Hypothyroidism. 8. Hypertension. 9. Depression. 10.Chronic respiratory failure, on home oxygen. 11.Severe pulmonary hypertension.  ADMISSION HISTORY: This is a 77 year old female who lives at home with her son.  The patient was diagnosed with a urinary tract infection a week prior to admission, was given two courses of ciprofloxacin, which did not resolve her symptoms.  The patient was increasingly weaker.  Her mention wasbecoming more altered.  She was subsequently brought to the emergency room for evaluation, where she was subsequently admitted for a possible urinary tract infection.  For details, please refer to the history and physical per Dr. Haroldine Laws on August 31st.  HOSPITAL COURSE: 1. Diverticulitis.  The patient was initially treated for urinary     tract infection with Rocephin.  Her abdominal pain persisted as     well as her leukocytosis.  The patient had WBC count of 13,000, was     persistent.  She also had some suprapubic pain.  Due to her history     of diverticulitis, a CT of abdomen and pelvis was conducted, which     did show acute diverticulitis in the sigmoid colon with underlying     abscess.  The patient was seen in consultation by Dr. Andrey Campanile from     [facility], who recommended that the patient had a     percutaneous drain plate placed by Interventional Radiology.  It     was subsequently done on September 3rd without  any immediate     complications.  The patient is currently draining fluid.  She is     being followed by both Interventional Radiology as well as a     General Surgery.  She will need repeat imaging in the next few     days.  She is currently on IV antibiotics with ciprofloxacin and     Flagyl.  We will follow up on final cultures from the aspirate     fluid. 2. Encephalopathy, this has resolved.  The patient's mental status is     currently at baseline.  She does have advanced dementia, likely     need nursing home placement as the family cannot manage her at     home. 3. The remainder of the patient's medical issues have remained stable.  CONSULTATIONS: 1. Interventional radiology. 2. [facility].  PROCEDURES: CT-guided pelvic abscess drain placement on September 3rd by Interventional Radiology.  That brings up-to-date on hospital course.     Erick Blinks, MD     JM/MEDQ  D:  01/07/2011  T:  01/07/2011  Job:  [id number]  Electronically Signed by Erick Blinks  on  01/14/2011 10:50:41 PM

## 2011-01-15 LAB — GLUCOSE, CAPILLARY
Glucose-Capillary: 187 mg/dL — ABNORMAL HIGH (ref 70–99)
Glucose-Capillary: 153 mg/dL — ABNORMAL HIGH (ref 70–99)

## 2011-01-15 LAB — CBC
HCT: 32.1 % — ABNORMAL LOW (ref 36.0–46.0)
Hemoglobin: 9.5 g/dL — ABNORMAL LOW (ref 12.0–15.0)
MCV: 87.7 fL (ref 78.0–100.0)
WBC: 12.9 10*3/uL — ABNORMAL HIGH (ref 4.0–10.5)

## 2011-01-15 LAB — DIFFERENTIAL
Basophils Absolute: 0.1 10*3/uL (ref 0.0–0.1)
Lymphocytes Relative: 12 % (ref 12–46)
Lymphs Abs: 1.5 10*3/uL (ref 0.7–4.0)
Monocytes Absolute: 1.1 10*3/uL — ABNORMAL HIGH (ref 0.1–1.0)
Neutro Abs: 9.8 10*3/uL — ABNORMAL HIGH (ref 1.7–7.7)

## 2011-01-15 LAB — BASIC METABOLIC PANEL
BUN: 20 mg/dL (ref 6–23)
CO2: 34 mEq/L — ABNORMAL HIGH (ref 19–32)
Chloride: 99 mEq/L (ref 96–112)
Creatinine, Ser: 1.13 mg/dL — ABNORMAL HIGH (ref 0.50–1.10)
GFR calc Af Amer: 55 mL/min — ABNORMAL LOW (ref >60)
Glucose, Bld: 133 mg/dL — ABNORMAL HIGH (ref 70–99)
Potassium: 4.2 mEq/L (ref 3.5–5.1)

## 2011-01-15 LAB — IRON AND TIBC
Iron: 22 ug/dL — ABNORMAL LOW (ref 42–135)
UIBC: 190 ug/dL (ref 125–400)

## 2011-01-16 ENCOUNTER — Inpatient Hospital Stay (HOSPITAL_COMMUNITY): Payer: Medicare Other

## 2011-01-16 LAB — GLUCOSE, CAPILLARY
Glucose-Capillary: 245 mg/dL — ABNORMAL HIGH (ref 70–99)
Glucose-Capillary: 217 mg/dL — ABNORMAL HIGH (ref 70–99)
Glucose-Capillary: 189 mg/dL — ABNORMAL HIGH (ref 70–99)

## 2011-01-16 LAB — CBC
HCT: 32.2 % — ABNORMAL LOW (ref 36.0–46.0)
Hemoglobin: 9.8 g/dL — ABNORMAL LOW (ref 12.0–15.0)
MCH: 26.5 pg (ref 26.0–34.0)
MCHC: 30.4 g/dL (ref 30.0–36.0)
RBC: 3.7 MIL/uL — ABNORMAL LOW (ref 3.87–5.11)

## 2011-01-16 LAB — FERRITIN: Ferritin: 62 ng/mL (ref 10–291)

## 2011-01-17 LAB — GLUCOSE, CAPILLARY: Glucose-Capillary: 130 mg/dL — ABNORMAL HIGH (ref 70–99)

## 2011-01-17 LAB — CBC
MCH: 25.9 pg — ABNORMAL LOW (ref 26.0–34.0)
MCV: 87.8 fL (ref 78.0–100.0)
Platelets: 306 10*3/uL (ref 150–400)
RDW: 15.5 % (ref 11.5–15.5)

## 2011-01-17 LAB — BASIC METABOLIC PANEL
Calcium: 8.9 mg/dL (ref 8.4–10.5)
Creatinine, Ser: 1.06 mg/dL (ref 0.50–1.10)
GFR calc Af Amer: 59 mL/min — ABNORMAL LOW (ref >60)
GFR calc non Af Amer: 49 mL/min — ABNORMAL LOW (ref >60)
Sodium: 138 mEq/L (ref 135–145)

## 2011-01-17 NOTE — Consult Note (Signed)
[name], [name]                 ACCOUNT NO.:  [id number]  MEDICAL RECORD NO.:  [id number]  LOCATION:  1334                         FACILITY:  [facility]  PHYSICIAN:  Mary Sella. Andrey Campanile, MD     DATE OF BIRTH:  1918-03-29  DATE OF CONSULTATION:  01/05/2011 DATE OF DISCHARGE:                                CONSULTATION   REQUESTING PHYSICIAN:  Erick Blinks, MD  REASON FOR CONSULTATION:  Sigmoid diverticulitis with diverticular abscess.  CHIEF COMPLAINT:  Unable to elicit.  HISTORY OF PRESENT ILLNESS:  [name] is a 77 year old obese Caucasian female who was brought to the emergency room yesterday by her son for progressive weakness and worsening of her mentation.  Apparently she had been diagnosed with UTI over a week ago and treated with a short course of antibiotics.  She was put back on a longer course antibiotics; however, according to the family by report, she had progressive worsening of her mentation and at times was unresponsive to them.  There is also mention of complaints of left lower quadrant pain.  They denied any nausea, vomiting, diarrhea, constipation, melena, hematochezia.  She was brought to the emergency room and evaluated.  Please note that this majority of the history was obtained from the medical records, the patient was unable to participate in her exam.  PAST MEDICAL HISTORY: 1. Coronary artery disease. 2. Diabetes mellitus. 3. Hypertension. 4. Hypothyroidism. 5. Depression. 6. Diverticulosis. 7. Chronic respiratory failure, on O2 at home. 8. Pulmonary hypertension. 9. Probable obstructive sleep apnea.  PAST SURGICAL HISTORY: 1. Appendectomy. 2. Hysterectomy. 3. Multiple back surgeries. 4. T and A. 5. Cholecystectomy. 6. Cataract surgery. 7. Pacemaker insertion.  ALLERGIES:  CODEINE and PENICILLIN.  SOCIAL HISTORY:  She lives with her son.  Not a current smoker, alcohol. On drugs.  FAMILY HISTORY:  Significant for colon, breast, and throat  cancer.  HOME MEDICATIONS:  Include multivitamin, simvastatin, Lasix, Cipro, glyburide, Norvasc, Synthroid, citalopram, and Flagyl.  REVIEW OF SYSTEMS:  A limited 10-point review of systems was performed, but was somewhat difficult to perform given the patient's inability to participate.  Systems are negative except as mentioned in HPI.  PHYSICAL EXAM:  VITAL SIGNS:  Temperature is 98.7, heart rate 65, respirations 16, blood pressure 145/77, 94% on 2 L. GENERAL:  She is an obese elderly-appearing Caucasian female, who is asleep, who is resting comfortably but she is easily arousable. HEENT:  Atraumatic, normocephalic.  Pupils are equal.  No scleral icterus.  No external ear lesions. NECK:  Supple.  No lymphadenopathy.  Trachea is midline. PULMONARY:  Lungs are clear.  Symmetric chest rise. No accessory use of muscles. CARDIOVASCULAR:  Regular rhythm.  No murmurs. ABDOMEN:  Obese, soft, mild distention.  She has a scar in her right lower quadrant and a Pfannenstiel incision.  She has got left lower quadrant tenderness.  There is no rebound.  There is no guarding.  There is no peritonitis.  She has positive bowel sounds. MUSCULOSKELETAL:  She moves all extremities.  No obvious joint deformity. NEUROLOGICAL:  Nonfocal.  Sensation is grossly intact. SKIN:  There is no jaundice or edema. PSYCHIATRIC:  She tells me it is  [place] and that is [place], but other than that she does not tell me much more than that.  LABS:  Data reviewed.  Labs; white count 13.4, hemoglobin 9.7, hematocrit 31.8, platelet count 344.  Sodium 136, potassium 4.4, chloride 97, bicarb 34, BUN 21, creatinine 1.17, blood sugar 103, calcium 8.7.  Blood cultures negative.  Urine culture negative.  BNP on admission was 1327.  RADIOGRAPHS: 1. Chest x-ray shows no infiltrate. 2. CT of abdomen and pelvis which I personally reviewed, showed     proximal sigmoid diverticulitis with wall thickening within the      adjacent, 4.6 x 3.2 fluid collection which is above the abscess,     consistent with the abscess.  There is also a second collection     measuring 3.9 x 4.1 which the radiologist read as an abscess versus     a left ovarian mass.  I think this can be more consistent with an     abscess.  There is no free air.  There is no bowel obstruction.     There is no free fluid.  ASSESSMENT/PLAN:  A 77 year old obese Caucasian female with coronary artery disease, diabetes mellitus, hypertension, pulmonary hypertension, chronic respiratory failure, hypothyroidism, anemia with sigmoid diverticulitis with abscess.  I recommend bowel rest and n.p.o. status. I recommend broad-spectrum antibiotic coverage for gut floras such as Cipro and Flagyl for her diverticulitis.  She needs to have a higher consultation for percutaneous drain for at least one if not both abscesses if Radiology can determine the second fluid collection is intact and abscess not an ovarian mass.  Will follow up along consultation.  Thank you for the consultation.     Mary Sella. Andrey Campanile, MD     EMW/MEDQ  D:  01/05/2011  T:  01/05/2011  Job:  [id number]  Electronically Signed by Gaynelle Adu M.D. on 01/17/2011 09:50:59 AM

## 2011-01-18 LAB — WOUND CULTURE: Gram Stain: NONE SEEN

## 2011-01-20 ENCOUNTER — Other Ambulatory Visit (HOSPITAL_COMMUNITY): Payer: Self-pay | Admitting: Internal Medicine

## 2011-01-20 DIAGNOSIS — L0291 Cutaneous abscess, unspecified: Secondary | ICD-10-CM

## 2011-01-22 ENCOUNTER — Ambulatory Visit (HOSPITAL_COMMUNITY)
Admission: RE | Admit: 2011-01-22 | Discharge: 2011-01-22 | Disposition: A | Discharge status: Home / Self Care | Payer: Medicare Other | Source: Ambulatory Visit | Attending: Internal Medicine | Admitting: Internal Medicine

## 2011-01-22 DIAGNOSIS — L0291 Cutaneous abscess, unspecified: Secondary | ICD-10-CM

## 2011-01-22 DIAGNOSIS — K630 Abscess of intestine: Secondary | ICD-10-CM | POA: Insufficient documentation

## 2011-01-28 NOTE — Progress Notes (Signed)
[name], [name]                 ACCOUNT NO.:  [id number]  MEDICAL RECORD NO.:  [id number]  LOCATION:  1334                         FACILITY:  [facility]  PHYSICIAN:  Manson Passey, MD        DATE OF BIRTH:  [birth year]                                PROGRESS NOTE   PRIMARY CARE PHYSICIAN: Kandyce Rud, MD  CODE STATUS: DNR.  Patient is a 77 year old female who lives at home, diagnosed with UTI. Patient was brought to the emergency department due to progressively worsening weakness and altered mental status.  As per family, she seemed to be unresponsive on admission.  Patient was treated with antibiotics for UTI.  On January 04, 2011, CT abdomen and pelvis with contrast was done and it showed proximal sigmoid diverticulitis with adjacent diverticular abscess 3.2 x 4.6 cm.  On January 06, 2011, drainage was performed by Interventional Radiology.  Throughout the hospital course, the drainage output varied from 15 to 30 cc on a daily basis.  Patient reports improvement in terms of weakness, as well as mentation.  She is tolerating solids and liquids.  CURRENT DIAGNOSES LIST: 1. Acute diverticulitis with an underlying abscess, slowly resolving,     status post placement of CT-guided drainage catheter January 06, 2011. 2. Altered mental status/dementia.  CURRENT MEDICATIONS: 1. Ciprofloxacin 500 mg twice a day. 2. Citalopram 30 mg daily. 3. NovoLog sliding scale, insulin sensitive. 4. Levothyroxine 112 mcg a day. 5. Flagyl 500 mg q.8 hours p.o. 6. Multivitamin 1 tablet daily. 7. Simvastatin 20 mg at bedtime. 8. Hydrocodone/APAP 1 to 2 tablets every 4 hours as needed for pain.  PHYSICAL EXAM: VITAL SIGNS:  Blood pressure 156/78, pulse 62, respirations 18, temperature 98.9, oxygen saturation 98% on 2 liters nasal cannula. GENERAL APPEARANCE:  No acute distress, appears comfortable. LUNGS:  Bilateral air entry; no wheezing. CARDIOVASCULAR:  S1, S2, audible, regular rate  and rhythm. ABDOMEN:  Positive bowel sounds, soft, nontender, nondistended; percutaneous drain in place with no stigmata of infection. EXTREMITIES:  Pulses palpable bilaterally, no lower extremity edema. NEUROLOGICAL:  Alert, awake, oriented x3; no focal neurological deficit.  LABORATORY DATA: January 14, 2011, sodium 137, potassium 4, chloride 99, bicarb 35, BUN 19, creatinine 1.5, glucose 139, calcium 9.1.  White blood cells 10.8, hemoglobin 9.6, platelets 373.  ASSESSMENT/PLAN: 1. Sigmoid diverticulitis with draining abscess.  As per CT abdomen     and pelvis January 09, 2011, the fluid collection/abscess with     indwelling pigtail drain measures 4.7 x 3.2 cm, previously 5.1 x     4.4 cm.  There is associated inferomedial fluid collection/abscess     adjacent to the bladder measures 4.6 x 3.2 cm, grossly unchanged.     As per Interventional Radiology, the plan is to continue with the     drainage at the region of abscess until the output is about 10 cc     over 24 hours.  CAT scan of the of the abdomen and pelvis will be     repeated in about 2 to 3 days which makes January 16, 2011.  We     will continue Cipro  and Flagyl for now. 2. Encephalopathy, resolved.  Patient is alert, awake, oriented x3.     Family requests safe discharge plan for the patient possibly     assisted living facility or skill nursing facility. 3. Disposition.  Social work involvement for safe discharge plan.          ______________________________ Manson Passey, MD     AD/MEDQ  D:  01/14/2011  T:  01/15/2011  Job:  [id number]  Electronically Signed by Manson Passey MD on 01/28/2011 05:24:29 PM

## 2011-01-29 LAB — CBC
HCT: 27.6 — ABNORMAL LOW
HCT: 27.8 — ABNORMAL LOW
HCT: 28.6 — ABNORMAL LOW
HCT: 29.3 — ABNORMAL LOW
HCT: 29.4 — ABNORMAL LOW
Hemoglobin: 9.1 — ABNORMAL LOW
Hemoglobin: 9.4 — ABNORMAL LOW
Hemoglobin: 9.7 — ABNORMAL LOW
MCHC: 32
MCHC: 32
MCHC: 32.3
MCHC: 33
MCV: 83.6
MCV: 83.9
MCV: 84.3
Platelets: 261
Platelets: 263
Platelets: 279
RBC: 3.29 — ABNORMAL LOW
RBC: 3.5 — ABNORMAL LOW
RDW: 14.9
RDW: 15.1
RDW: 15.1
RDW: 15.7 — ABNORMAL HIGH
WBC: 13.4 — ABNORMAL HIGH

## 2011-01-29 LAB — CK TOTAL AND CKMB (NOT AT ARMC)
CK, MB: 1.7
Relative Index: INVALID

## 2011-01-29 LAB — POCT CARDIAC MARKERS
Myoglobin, poc: 79.8
Operator id: 173591

## 2011-01-29 LAB — DIFFERENTIAL
Basophils Absolute: 0.1
Basophils Absolute: 0.2 — ABNORMAL HIGH
Basophils Relative: 1
Basophils Relative: 1
Eosinophils Absolute: 0.2
Eosinophils Relative: 12 — ABNORMAL HIGH
Eosinophils Relative: 2
Lymphocytes Relative: 14
Lymphocytes Relative: 24
Monocytes Absolute: 0.9
Neutro Abs: 6.2

## 2011-01-29 LAB — COMPREHENSIVE METABOLIC PANEL
Albumin: 3 — ABNORMAL LOW
Alkaline Phosphatase: 71
BUN: 20
Calcium: 8.7
Potassium: 3.9
Sodium: 141
Total Protein: 6.2

## 2011-01-29 LAB — TROPONIN I: Troponin I: 0.03

## 2011-01-29 LAB — BASIC METABOLIC PANEL
BUN: 22
BUN: 28 — ABNORMAL HIGH
BUN: 30 — ABNORMAL HIGH
CO2: 35 — ABNORMAL HIGH
CO2: 37 — ABNORMAL HIGH
Calcium: 8.6
Calcium: 8.8
Chloride: 94 — ABNORMAL LOW
Creatinine, Ser: 1.25 — ABNORMAL HIGH
Creatinine, Ser: 1.47 — ABNORMAL HIGH
GFR calc non Af Amer: 37 — ABNORMAL LOW
GFR calc non Af Amer: 41 — ABNORMAL LOW
Glucose, Bld: 113 — ABNORMAL HIGH
Glucose, Bld: 83
Glucose, Bld: 95
Potassium: 3.9
Sodium: 139
Sodium: 140

## 2011-01-29 LAB — CARDIAC PANEL(CRET KIN+CKTOT+MB+TROPI)
CK, MB: 1.5
Relative Index: INVALID
Relative Index: INVALID
Total CK: 64
Total CK: 73
Troponin I: 0.03

## 2011-01-29 LAB — RETICULOCYTES
Retic Count, Absolute: 84.5
Retic Ct Pct: 2.4

## 2011-01-29 LAB — IRON AND TIBC: Iron: 25 — ABNORMAL LOW

## 2011-01-29 LAB — HEMOGLOBIN A1C
Hgb A1c MFr Bld: 6.4 — ABNORMAL HIGH
Mean Plasma Glucose: 151

## 2011-01-29 LAB — LIPID PANEL
Cholesterol: 121
LDL Cholesterol: 46
Total CHOL/HDL Ratio: 2.1

## 2011-01-29 LAB — POCT I-STAT, CHEM 8
BUN: 24 — ABNORMAL HIGH
HCT: 32 — ABNORMAL LOW
Hemoglobin: 10.9 — ABNORMAL LOW
Sodium: 138
TCO2: 29

## 2011-01-29 LAB — ANA: Anti Nuclear Antibody(ANA): NEGATIVE

## 2011-01-29 LAB — PROTIME-INR: INR: 1.1

## 2011-01-29 LAB — VITAMIN B12: Vitamin B-12: 319 (ref 211–911)

## 2011-01-29 LAB — TSH: TSH: 2.911

## 2011-01-30 NOTE — Discharge Summary (Signed)
[name], [name]                 ACCOUNT NO.:  [id number]  MEDICAL RECORD NO.:  [id number]  LOCATION:  1334                         FACILITY:  [facility]  PHYSICIAN:  Hillery [name], M.D.   DATE OF BIRTH:  [birth year]  DATE OF ADMISSION:  01/02/2011 DATE OF DISCHARGE:  01/17/2011                        DISCHARGE SUMMARY - REFERRING   PRIMARY CARE PHYSICIAN:  Dr. Kandyce [name] at [facility].  DISCHARGE DIAGNOSES: 1. Sigmoid diverticulitis with diverticular abscess x2, status post     percutaneous drain, now removed. 2. Toxic encephalopathy. 3. Dementia. 4. Normocytic anemia. 5. History of coronary artery disease. 6. Hyperlipidemia. 7. Type 2 diabetes. 8. Hypertension. 9. Hypothyroidism. 10.Chronic respiratory failure on low-flow chronic oxygen therapy. 11.Stage 2 to 3 chronic kidney disease. 12.Depression.  DISCHARGE MEDICATIONS: 1. Albuterol 2.5 mg inhaled q.2 h. p.r.n. shortness of breath. 2. Doxycycline 100 mg p.o. b.i.d. x7 days. 3. Vicodin 5/325 mg 1 to 2 tablets p.o. q.4 h. p.r.n. pain. 4. Insulin pen, start with NovoLog, sliding scale insulin q.a.c. and     h.s., insulin sensitive scale. 5. Bactroban 2% ointment 1 application topically to the left lower     quadrant percutaneous drain site daily. 6. Citalopram 20 mg p.o. daily. 7. Aspirin 81 mg p.o. daily. 8. Calcium carbonate/vitamin D 600 mg/200 international units 1 tablet     p.o. daily. 9. Cipro 500 mg p.o. q.12 h. x1 week. 10.Flagyl 500 mg p.o. q.8 h. x1 week. 11.Glyburide 1.25 mg p.o. daily. 12.Levothyroxine 112 mcg p.o. daily. 13.Lisinopril 40 mg p.o. daily. 14.Melatonin OTC 1 tablet p.o. q.h.s. daily. 15.Multivitamins 1 tablet p.o. daily. 16.Norvasc 5 mg p.o. daily. 17.Simvastatin 20 mg p.o. daily. 18.Tylenol 650 mg p.o. q.6 h p.r.n. pain.  Note:  Furosemide has been discontinued.  CONSULTATIONS: 1. Dr. Gaynelle [name], General Surgery. 2. Dr. Miles [name] of Interventional Radiology.  BRIEF ADMISSION  HISTORY OF PRESENT ILLNESS:  The patient is a 77 year old female who presented to the hospital with a chief complaint of weakness.  She had recently been treated as an outpatient for urinary tract infection.  She also complained of left lower quadrant abdominal pain, but no frank dysuria.  Upon initial evaluation in the emergency department, she was found to be weak and possibly septic from a possible urinary tract infection and therefore, was referred to the hospitalist service for further evaluation and treatment.  For the full details, please see the dictated report done by Dr. Joneen [name].  PROCEDURES AND DIAGNOSTIC STUDIES: 1. Chest x-ray on January 02, 2011, showed cardiomegaly with mild     pulmonary edema.  Right hilar fullness, likely vascular. 2. Chest x-ray on January 04, 2011, showed central vascular     congestion without convincing edema.  Cardiomegaly.  No focal     infiltrate. 3. CT scan of the abdomen and pelvis on January 04, 2011, showed a     proximal sigmoid diverticulitis with adjacent 3.2 x 4.6 cm     diverticular abscess, 3.9 x 4.1 cm cystic structure/collection in     left pelvis, which may represent a second diverticular abscess, but     a cystic left ovarian mass could not be excluded. 4. CT-guided pelvic abscess  drain placement performed by Dr. Lowella [name] on     January 06, 2011. 5. CT scan of the abdomen and pelvis on January 09, 2011, showed     sigmoid diverticulitis.  Associated lateral fluid     collection/abscess with indwelling pigtail drain, now measuring 4.7     x 3.2 cm.  Associated inferomedial fluid collection/abscess     adjacent to the bladder measures 4.6 x 3.2 cm, grossly unchanged. 6. Fluoroscopic drainage catheter evaluation performed on January 16, 2011, showed no definite communication of the existing left     lower quadrant abscess drain catheter between either the sigmoid     colon or the known adjacent fluid collections.  Following  the     procedure, the catheter was removed, intact at the patient's     bedside after discussion of the findings with the clinical team.     Given that the patient has known additional fluid collections     within the left lower quadrant of the abdomen, we would recommend     continued antibiotic coverage with close clinical evaluation for     recurrent infection (fever, elevated white blood cell count, and     worsening abdominal pain).  DISCHARGE LABORATORY VALUES:  Wound cultures of the percutaneous drain site are preliminary, but growing abundant Staphylococcus aureus. Sodium is 138, potassium 4.0, chloride 100, bicarbonate 34, BUN 19, creatinine 1.06, glucose 140, and calcium 8.9.  White blood cell count was 11.8, hemoglobin 8.9, hematocrit 30.1, and platelets 306 and anemia panel showed an iron of 22, total iron binding capacity 212, 10% saturation, urine iron binding capacity 190, vitamin B12 of 481, serum folate greater than 20, and ferritin 62.  Blood cultures drawn on admission were negative.  Abscess fluid cultures grew Morganella morganii, Cipro sensitive.  HOSPITAL COURSE BY PROBLEM: 1. Sigmoid diverticulitis with abscess x2:  The patient underwent     percutaneous drainage as noted above.  The second abscess was not     drained, but was stable during the course of her hospital stay.  At     this point, the drain on the first abscess has been pulled and     there is no evident communication of the abscess cavity with the     colon or adjacent fluid collections.  The patient will remain on     empiric Cipro and Flagyl for an additional 7 days of therapy.     Following this, she should have re-imaging done of her abdomen and     pelvis to evaluate the need for further percutaneous drainage     versus antibiotic therapy versus watchful waiting.  If she develops     recurrent signs of infection including fever, rising white blood     cell count, or worsening abdominal pain,  we would obtain a CT scan     more emergently.  Of note, abscess cultures did grow Morganella     morganii that was Cipro sensitive.  She has been maintained on both     Cipro and Flagyl since she has additional fluid collections that     have not been sampled. 2. Toxic encephalopathy in the setting of dementia:  The patient's     mental status appears to be at baseline. 3. Chronic normocytic anemia:  Anemia panel was checked and found to     be consistent with anemia of chronic disease. 4. History of coronary artery disease/hyperlipidemia:  The patient was  placed on statin therapy after it was held for several days.  Her     aspirin has been on hold throughout her hospital stay in case she     needed to go for surgical intervention.  She can safely resume this     at discharge. 5. Type 2 diabetes:  The patient has had reasonable control while in     the hospital. 6. Hypertension:  The patient's Norvasc was resumed and at discharge,     her ACE inhibitor will be resumed as well.  Her discharge blood     pressure is 154/81. 7. Hypothyroidism:  The patient was maintained on her usual outpatient     dose of Synthroid. 8. Chronic respiratory failure:  The patient is on chronic home     oxygen, which she should continue in her skilled nursing facility.     She should maintain oxygen saturations greater than 92% and can use     the oxygen at 2 to 3 L to achieve this goal. 9. Stage 2 to 3 chronic kidney disease:  The patient's creatinine     appears to be stable with an estimated GFR of 44 mL per minute. 10.Depression:  The patient was on 30 mg of Celexa prior to admission.     Pharmacy does recommend decreasing dose to 20 mg due to the risk of     QTC prolongation in patients greater than age 89.  Accordingly, the     patient's dosage has been adjusted.  DISPOSITION:  The patient is medically stable and will be discharged to skilled nursing facility.  CONDITION ON DISCHARGE:   Improved.  DISCHARGE INSTRUCTIONS:  Activity:  Increase activity slowly, walk with assistance with a walker.  May shower/bathe.  Diet:  Low-sodium, heart-healthy, diabetic.  Wound care:  Change dressings to left lower quadrant drain site daily and as needed.  Wash this area with antibacterial soap daily and apply Bactroban to the site.  Monitor closely for signs of increased drainage, increased erythema, or worsening cellulitis.  RECOMMENDATIONS FOR FOLLOWUP:  Follow up with Dr. Larwance Sachs in 2 weeks or with a physician at the skilled nursing facility.  Follow up CT scan of the abdomen and pelvis in 1 week or sooner if indicated by signs of ongoing infection.  Time spent coordinating care for discharge, discharge instructions including face-to-face time and speaking with the patient's daughter equals approximately 40 minutes.     Hillery [name], M.D.     CR/MEDQ  D:  01/17/2011  T:  01/17/2011  Job:  [id number]  cc:   Claire Rud, MD Fax: [phone]  Electronically Signed by Hillery [name] M.D. on 01/30/2011 05:50:24 PM

## 2011-02-10 ENCOUNTER — Inpatient Hospital Stay (HOSPITAL_COMMUNITY)
Admission: EM | Admit: 2011-02-10 | Discharge: 2011-02-17 | DRG: 193 | Disposition: A | Discharge status: Skilled Nursing Facility | Payer: Medicare Other | Attending: Internal Medicine | Admitting: Internal Medicine

## 2011-02-10 ENCOUNTER — Emergency Department (HOSPITAL_COMMUNITY): Payer: Medicare Other

## 2011-02-10 DIAGNOSIS — Z79899 Other long term (current) drug therapy: Secondary | ICD-10-CM

## 2011-02-10 DIAGNOSIS — K5730 Diverticulosis of large intestine without perforation or abscess without bleeding: Secondary | ICD-10-CM | POA: Diagnosis present

## 2011-02-10 DIAGNOSIS — I509 Heart failure, unspecified: Secondary | ICD-10-CM | POA: Diagnosis present

## 2011-02-10 DIAGNOSIS — I5033 Acute on chronic diastolic (congestive) heart failure: Secondary | ICD-10-CM | POA: Diagnosis present

## 2011-02-10 DIAGNOSIS — F329 Major depressive disorder, single episode, unspecified: Secondary | ICD-10-CM | POA: Diagnosis present

## 2011-02-10 DIAGNOSIS — F0390 Unspecified dementia without behavioral disturbance: Secondary | ICD-10-CM | POA: Diagnosis present

## 2011-02-10 DIAGNOSIS — I2510 Atherosclerotic heart disease of native coronary artery without angina pectoris: Secondary | ICD-10-CM | POA: Diagnosis present

## 2011-02-10 DIAGNOSIS — Z66 Do not resuscitate: Secondary | ICD-10-CM | POA: Diagnosis present

## 2011-02-10 DIAGNOSIS — Z880 Allergy status to penicillin: Secondary | ICD-10-CM

## 2011-02-10 DIAGNOSIS — F3289 Other specified depressive episodes: Secondary | ICD-10-CM | POA: Diagnosis present

## 2011-02-10 DIAGNOSIS — N183 Chronic kidney disease, stage 3 unspecified: Secondary | ICD-10-CM | POA: Diagnosis present

## 2011-02-10 DIAGNOSIS — K649 Unspecified hemorrhoids: Secondary | ICD-10-CM | POA: Diagnosis present

## 2011-02-10 DIAGNOSIS — J90 Pleural effusion, not elsewhere classified: Secondary | ICD-10-CM | POA: Diagnosis present

## 2011-02-10 DIAGNOSIS — J9819 Other pulmonary collapse: Secondary | ICD-10-CM | POA: Diagnosis present

## 2011-02-10 DIAGNOSIS — E119 Type 2 diabetes mellitus without complications: Secondary | ICD-10-CM | POA: Diagnosis present

## 2011-02-10 DIAGNOSIS — J9620 Acute and chronic respiratory failure, unspecified whether with hypoxia or hypercapnia: Secondary | ICD-10-CM | POA: Diagnosis present

## 2011-02-10 DIAGNOSIS — J189 Pneumonia, unspecified organism: Principal | ICD-10-CM | POA: Diagnosis present

## 2011-02-10 DIAGNOSIS — Z794 Long term (current) use of insulin: Secondary | ICD-10-CM

## 2011-02-10 DIAGNOSIS — E039 Hypothyroidism, unspecified: Secondary | ICD-10-CM | POA: Diagnosis present

## 2011-02-10 DIAGNOSIS — I129 Hypertensive chronic kidney disease with stage 1 through stage 4 chronic kidney disease, or unspecified chronic kidney disease: Secondary | ICD-10-CM | POA: Diagnosis present

## 2011-02-10 DIAGNOSIS — I2789 Other specified pulmonary heart diseases: Secondary | ICD-10-CM | POA: Diagnosis present

## 2011-02-10 DIAGNOSIS — M25519 Pain in unspecified shoulder: Secondary | ICD-10-CM | POA: Diagnosis present

## 2011-02-10 DIAGNOSIS — R5381 Other malaise: Secondary | ICD-10-CM | POA: Diagnosis present

## 2011-02-10 DIAGNOSIS — Z7982 Long term (current) use of aspirin: Secondary | ICD-10-CM

## 2011-02-10 LAB — CARDIAC PANEL(CRET KIN+CKTOT+MB+TROPI)
CK, MB: 3.7 ng/mL (ref 0.3–4.0)
Relative Index: INVALID (ref 0.0–2.5)
Total CK: 58 U/L (ref 7–177)
Troponin I: <0.3 ng/mL (ref <0.30)

## 2011-02-10 LAB — CBC
Hemoglobin: 9 g/dL — ABNORMAL LOW (ref 12.0–15.0)
MCH: 26.1 pg (ref 26.0–34.0)
MCV: 89 fL (ref 78.0–100.0)
RBC: 3.45 MIL/uL — ABNORMAL LOW (ref 3.87–5.11)
WBC: 11.5 10*3/uL — ABNORMAL HIGH (ref 4.0–10.5)

## 2011-02-10 LAB — LACTIC ACID, PLASMA: Lactic Acid, Venous: 1.1 mmol/L (ref 0.5–2.2)

## 2011-02-10 LAB — POCT I-STAT 3, ART BLOOD GAS (G3+)
Acid-Base Excess: 10 mmol/L — ABNORMAL HIGH (ref 0.0–2.0)
Patient temperature: 98.6
pH, Arterial: 7.37 (ref 7.350–7.400)

## 2011-02-10 LAB — COMPREHENSIVE METABOLIC PANEL
Albumin: 2.8 g/dL — ABNORMAL LOW (ref 3.5–5.2)
Alkaline Phosphatase: 62 U/L (ref 39–117)
BUN: 16 mg/dL (ref 6–23)
Calcium: 9.2 mg/dL (ref 8.4–10.5)
GFR calc Af Amer: 58 mL/min — ABNORMAL LOW (ref >90)
Glucose, Bld: 117 mg/dL — ABNORMAL HIGH (ref 70–99)
Potassium: 3.7 mEq/L (ref 3.5–5.1)
Sodium: 140 mEq/L (ref 135–145)
Total Protein: 7.3 g/dL (ref 6.0–8.3)

## 2011-02-10 LAB — URINE MICROSCOPIC-ADD ON

## 2011-02-10 LAB — URINALYSIS, ROUTINE W REFLEX MICROSCOPIC
Glucose, UA: NEGATIVE mg/dL
Ketones, ur: NEGATIVE mg/dL
Leukocytes, UA: NEGATIVE
Nitrite: NEGATIVE
Specific Gravity, Urine: 1.022 (ref 1.005–1.030)
pH: 5 (ref 5.0–8.0)

## 2011-02-10 LAB — POCT I-STAT TROPONIN I: Troponin i, poc: 0 ng/mL (ref 0.00–0.08)

## 2011-02-10 LAB — DIFFERENTIAL
Lymphs Abs: 2.1 10*3/uL (ref 0.7–4.0)
Monocytes Relative: 5 % (ref 3–12)
Neutro Abs: 8 10*3/uL — ABNORMAL HIGH (ref 1.7–7.7)
Neutrophils Relative %: 70 % (ref 43–77)

## 2011-02-10 LAB — MRSA PCR SCREENING: MRSA by PCR: NEGATIVE

## 2011-02-10 LAB — CK TOTAL AND CKMB (NOT AT ARMC): Relative Index: INVALID (ref 0.0–2.5)

## 2011-02-10 LAB — PROTIME-INR: Prothrombin Time: 13.8 seconds (ref 11.6–15.2)

## 2011-02-11 ENCOUNTER — Inpatient Hospital Stay (HOSPITAL_COMMUNITY): Payer: Medicare Other

## 2011-02-11 DIAGNOSIS — I369 Nonrheumatic tricuspid valve disorder, unspecified: Secondary | ICD-10-CM

## 2011-02-11 LAB — CBC
Hemoglobin: 8.5 g/dL — ABNORMAL LOW (ref 12.0–15.0)
MCH: 26.1 pg (ref 26.0–34.0)
RBC: 3.26 MIL/uL — ABNORMAL LOW (ref 3.87–5.11)
WBC: 11.4 10*3/uL — ABNORMAL HIGH (ref 4.0–10.5)

## 2011-02-11 LAB — POCT I-STAT 4, (NA,K, GLUC, HGB,HCT)
Glucose, Bld: 144 mg/dL — ABNORMAL HIGH (ref 70–99)
Hemoglobin: 9.9 g/dL — ABNORMAL LOW (ref 12.0–15.0)

## 2011-02-11 LAB — BASIC METABOLIC PANEL
Calcium: 8.9 mg/dL (ref 8.4–10.5)
Chloride: 90 mEq/L — ABNORMAL LOW (ref 96–112)
Creatinine, Ser: 1.08 mg/dL (ref 0.50–1.10)
GFR calc Af Amer: 50 mL/min — ABNORMAL LOW (ref >90)
GFR calc non Af Amer: 44 mL/min — ABNORMAL LOW (ref >90)

## 2011-02-11 LAB — GLUCOSE, CAPILLARY
Glucose-Capillary: 135 mg/dL — ABNORMAL HIGH (ref 70–99)
Glucose-Capillary: 151 mg/dL — ABNORMAL HIGH (ref 70–99)

## 2011-02-11 LAB — CARDIAC PANEL(CRET KIN+CKTOT+MB+TROPI)
CK, MB: 2.8 ng/mL (ref 0.3–4.0)
Relative Index: INVALID (ref 0.0–2.5)
Relative Index: INVALID (ref 0.0–2.5)
Total CK: 43 U/L (ref 7–177)
Total CK: 33 U/L (ref 7–177)
Total CK: 30 U/L (ref 7–177)
Troponin I: <0.3 ng/mL (ref <0.30)
Troponin I: <0.3 ng/mL (ref <0.30)
Troponin I: <0.3 ng/mL (ref <0.30)

## 2011-02-11 LAB — PRO B NATRIURETIC PEPTIDE: Pro B Natriuretic peptide (BNP): 2743 pg/mL — ABNORMAL HIGH (ref 0–450)

## 2011-02-12 LAB — BASIC METABOLIC PANEL
BUN: 22 mg/dL (ref 6–23)
CO2: 42 mEq/L (ref 19–32)
Chloride: 93 mEq/L — ABNORMAL LOW (ref 96–112)
Creatinine, Ser: 1.19 mg/dL — ABNORMAL HIGH (ref 0.50–1.10)
GFR calc Af Amer: 45 mL/min — ABNORMAL LOW (ref >90)
Glucose, Bld: 83 mg/dL (ref 70–99)
Potassium: 3.3 mEq/L — ABNORMAL LOW (ref 3.5–5.1)

## 2011-02-12 LAB — DIFFERENTIAL
Eosinophils Absolute: 0.4 10*3/uL (ref 0.0–0.7)
Eosinophils Relative: 5 % (ref 0–5)
Lymphs Abs: 2.2 10*3/uL (ref 0.7–4.0)
Monocytes Absolute: 0.6 10*3/uL (ref 0.1–1.0)

## 2011-02-12 LAB — GLUCOSE, CAPILLARY
Glucose-Capillary: 101 mg/dL — ABNORMAL HIGH (ref 70–99)
Glucose-Capillary: 117 mg/dL — ABNORMAL HIGH (ref 70–99)

## 2011-02-12 LAB — CBC
HCT: 26.8 % — ABNORMAL LOW (ref 36.0–46.0)
HCT: 28.2 % — ABNORMAL LOW (ref 36.0–46.0)
Hemoglobin: 8.4 g/dL — ABNORMAL LOW (ref 12.0–15.0)
MCHC: 28.7 g/dL — ABNORMAL LOW (ref 30.0–36.0)
MCV: 88.7 fL (ref 78.0–100.0)
MCV: 87.9 fL (ref 78.0–100.0)
Platelets: 263 10*3/uL (ref 150–400)
RBC: 3.21 MIL/uL — ABNORMAL LOW (ref 3.87–5.11)
RDW: 16.3 % — ABNORMAL HIGH (ref 11.5–15.5)
RDW: 16.3 % — ABNORMAL HIGH (ref 11.5–15.5)
WBC: 9.5 10*3/uL (ref 4.0–10.5)
WBC: 9.4 10*3/uL (ref 4.0–10.5)

## 2011-02-12 LAB — VITAMIN B12: Vitamin B-12: 405 pg/mL (ref 211–911)

## 2011-02-12 LAB — CARDIAC PANEL(CRET KIN+CKTOT+MB+TROPI): Troponin I: <0.3 ng/mL (ref <0.30)

## 2011-02-12 LAB — IRON AND TIBC
Iron: 34 ug/dL — ABNORMAL LOW (ref 42–135)
Saturation Ratios: 13 % — ABNORMAL LOW (ref 20–55)
TIBC: 272 ug/dL (ref 250–470)
UIBC: 238 ug/dL (ref 125–400)

## 2011-02-13 ENCOUNTER — Inpatient Hospital Stay (HOSPITAL_COMMUNITY): Payer: Medicare Other

## 2011-02-13 LAB — GLUCOSE, CAPILLARY
Glucose-Capillary: 102 mg/dL — ABNORMAL HIGH (ref 70–99)
Glucose-Capillary: 178 mg/dL — ABNORMAL HIGH (ref 70–99)

## 2011-02-13 LAB — CBC
Hemoglobin: 8.3 g/dL — ABNORMAL LOW (ref 12.0–15.0)
MCH: 26.3 pg (ref 26.0–34.0)
Platelets: 254 10*3/uL (ref 150–400)
RBC: 3.15 MIL/uL — ABNORMAL LOW (ref 3.87–5.11)
WBC: 9.1 10*3/uL (ref 4.0–10.5)

## 2011-02-13 LAB — BASIC METABOLIC PANEL
CO2: 43 mEq/L (ref 19–32)
Chloride: 93 mEq/L — ABNORMAL LOW (ref 96–112)
GFR calc Af Amer: 59 mL/min — ABNORMAL LOW (ref >90)
Potassium: 3.8 mEq/L (ref 3.5–5.1)
Sodium: 140 mEq/L (ref 135–145)

## 2011-02-13 LAB — URINE CULTURE: Culture  Setup Time: 201210081047

## 2011-02-13 LAB — PRO B NATRIURETIC PEPTIDE: Pro B Natriuretic peptide (BNP): 711 pg/mL — ABNORMAL HIGH (ref 0–450)

## 2011-02-13 LAB — EXPECTORATED SPUTUM ASSESSMENT W GRAM STAIN, RFLX TO RESP C

## 2011-02-14 LAB — CBC
HCT: 27.9 % — ABNORMAL LOW (ref 36.0–46.0)
Hemoglobin: 8.1 g/dL — ABNORMAL LOW (ref 12.0–15.0)
MCH: 25.6 pg — ABNORMAL LOW (ref 26.0–34.0)
MCHC: 29 g/dL — ABNORMAL LOW (ref 30.0–36.0)
RBC: 3.17 MIL/uL — ABNORMAL LOW (ref 3.87–5.11)

## 2011-02-14 LAB — GLUCOSE, CAPILLARY
Glucose-Capillary: 114 mg/dL — ABNORMAL HIGH (ref 70–99)
Glucose-Capillary: 176 mg/dL — ABNORMAL HIGH (ref 70–99)
Glucose-Capillary: 127 mg/dL — ABNORMAL HIGH (ref 70–99)

## 2011-02-15 LAB — BASIC METABOLIC PANEL
CO2: 41 mEq/L (ref 19–32)
Calcium: 9.3 mg/dL (ref 8.4–10.5)
Creatinine, Ser: 1.14 mg/dL — ABNORMAL HIGH (ref 0.50–1.10)
GFR calc non Af Amer: 41 mL/min — ABNORMAL LOW (ref >90)
Glucose, Bld: 118 mg/dL — ABNORMAL HIGH (ref 70–99)
Sodium: 140 mEq/L (ref 135–145)

## 2011-02-15 LAB — CARDIAC PANEL(CRET KIN+CKTOT+MB+TROPI)
Relative Index: INVALID (ref 0.0–2.5)
Troponin I: <0.3 ng/mL (ref <0.30)

## 2011-02-15 LAB — PRO B NATRIURETIC PEPTIDE: Pro B Natriuretic peptide (BNP): 570.2 pg/mL — ABNORMAL HIGH (ref 0–450)

## 2011-02-15 LAB — GLUCOSE, CAPILLARY: Glucose-Capillary: 132 mg/dL — ABNORMAL HIGH (ref 70–99)

## 2011-02-16 ENCOUNTER — Inpatient Hospital Stay (HOSPITAL_COMMUNITY): Payer: Medicare Other

## 2011-02-16 LAB — CULTURE, BLOOD (ROUTINE X 2)
Culture  Setup Time: 201210081407
Culture: NO GROWTH

## 2011-02-16 LAB — COMPREHENSIVE METABOLIC PANEL
Albumin: 2.6 g/dL — ABNORMAL LOW (ref 3.5–5.2)
BUN: 23 mg/dL (ref 6–23)
Calcium: 9.2 mg/dL (ref 8.4–10.5)
Chloride: 94 mEq/L — ABNORMAL LOW (ref 96–112)
Creatinine, Ser: 1.37 mg/dL — ABNORMAL HIGH (ref 0.50–1.10)
Total Bilirubin: 0.5 mg/dL (ref 0.3–1.2)
Total Protein: 6.2 g/dL (ref 6.0–8.3)

## 2011-02-16 LAB — CBC
HCT: 27.4 % — ABNORMAL LOW (ref 36.0–46.0)
MCH: 26.4 pg (ref 26.0–34.0)
MCHC: 29.6 g/dL — ABNORMAL LOW (ref 30.0–36.0)
MCV: 89.3 fL (ref 78.0–100.0)
RDW: 16.4 % — ABNORMAL HIGH (ref 11.5–15.5)

## 2011-02-16 LAB — GLUCOSE, CAPILLARY
Glucose-Capillary: 124 mg/dL — ABNORMAL HIGH (ref 70–99)
Glucose-Capillary: 138 mg/dL — ABNORMAL HIGH (ref 70–99)

## 2011-02-17 NOTE — H&P (Signed)
[name], [name]                 ACCOUNT NO.:  [id number]  MEDICAL RECORD NO.:  [id number]  LOCATION:  [facility]                         FACILITY:  [facility]  PHYSICIAN:  Clydia Llano, MD       DATE OF BIRTH:  1917-07-20  DATE OF ADMISSION:  02/10/2011 DATE OF DISCHARGE:                             HISTORY & PHYSICAL   PRIMARY CARE PHYSICIAN:  Kandyce Rud, MD  PRESENTING SYMPTOMS:  Shortness of breath.  HISTORY OF PRESENT ILLNESS:  [name] is a 77 year old Caucasian female.  The patient has history of dementia, diabetes mellitus type 2, hypertension.  She is a nursing home resident.  The patient was presented to the hospital because of the shortness of breath.  The patient does have dementia, so limited history was obtained from her as well as she was on BiPAP when I interviewed her.  According to her story to ED physician notes, the patient was being treated recently in the nursing home for pneumonia with oral azithromycin and intramuscular Rocephin since February 03, 2011, and should be finished by yesterday. According to the records, the patient was okay, but this morning he got very short of breath and she was hypoxic in room air around the 80s, so she was brought to the emergency department for further evaluation. Initial evaluation, chest x-ray showed worsened vascular congestion with retrocardiac atelectasis or infiltrates and mildly elevated proBNP of 875.  The patient will be admitted to the hospital for further evaluation.  PAST MEDICAL HISTORY: 1. Diabetes mellitus type 2. 2. Hypertension. 3. Hypothyroidism. 4. Depression. 5. Diverticulosis. 6. History of hemorrhoids. 7. Chronic respiratory failure on low flow chronic oxygen therapy. 8. History of diverticulitis with diverticular abscesses in September     2012. 9. History of anemia. 10.Stage II to III chronic kidney disease.  SOCIAL HISTORY:  The patient lives in [facility]. The rest of  social history not obtainable because of the patient's dementia.  FAMILY HISTORY:  From the medical records showed no history of colon cancer.  Medications to be reconciled by pharmacy including, 1. NovoLog sliding scale. 2. Lantus 10 units. 3. Lasix 40 mg every other day. 4. Citalopram. 5. Calcium. 6. Glyburide. 7. Lisinopril 40. 8. Multivitamin. 9. Norvasc. 10.Aspirin.  ALLERGIES:  The patient allergic to CODEINE and PENICILLIN; both causes rash.  REVIEW OF SYSTEMS:  Unobtainable because of dementia and BiPAP.  PHYSICAL EXAMINATION:  VITAL SIGNS:  Temperature is 98.1 respirations 22, pulse is 89, blood pressure is 150/56, O2 sats 100% on 50% oxygen through BiPAP. GENERAL:  The patient well-developed elderly Caucasian female wearing BiPAP leave in her back in mild-to-moderate distress. HEAD:  Normocephalic, atraumatic. EYES:  Pupils equal, reactive to light and accommodation. MOUTH:  Without oral thrush or lesions. NECK:  Supple.  No masses. CARDIOVASCULAR:  Regular rate and rhythm.  No murmurs, rubs, or gallops. RESPIRATORY:  Bilateral rhonchi anteriorly.  Procedure examination was not done because of the patient uncooperative. ABDOMEN:  Bowel sounds heard.  Soft, nontender, nondistended. EXTREMITIES:  Trace pedal edema. NEUROLOGICAL:  Not performed because of the patient uncooperative.  RESULTS: 1. Urinalysis showed negative leukocyte esterase, negative nitrite,  WBCs less than 3. 2. BMP; sodium 140, potassium 3.9, chloride 95, bicarb is 37, glucose     117, BUN is 16, creatinine is 0.9. 3. CMP; AST 22, ALT 13, albumin is 2.0, alk phos is 62, and total bili     is 0.2. 4. Cardiac enzymes; troponin undetectable, proBNP is 875.9. 5. CBC; WBC is 11.5, hemoglobin 9.0, hematocrit 30.7, platelets 248.  RADIOLOGY:  Chest x-ray showed worsened vascular congestion with possible mild edema, new retrocardiac atelectasis or infiltrate.  ASSESSMENT AND PLAN: 1. Acute on  chronic respiratory failure.  As mentioned above, the     patient on low-flow oxygen at home between 2 and 3 L of oxygen.     The patient came in hypoxic with that and she is being treated     recently for pneumonia.  This is likely to be secondary to unclear     pneumonia plus some fluid overload.  The patient on BiPAP, we will     try her off that, provide bronchodilators, mucolytics, antibiotics,     and oxygen as needed. 2. Pneumonia.  We will treat as healthcare-acquired pneumonia.  The     patient was recently on 7 days of Rocephin and vancomycin in the     nursing home.  I will treat with cefepime and vancomycin.  We will     recheck if the infiltrates resolved in 1 or 2 days.  We will re-     evaluate the need for antibiotics. 3. Congestive heart failure, type unknown, likely diastolic.  We will     obtain echocardiogram.  The patient on Lasix at home.  We will put     her on Lasix b.i.d. and check her renal function daily.  Check BNP     and chest x-ray in the morning. 4. Type 2 diabetes mellitus.  We will give her half dose of her     insulin and restart her sliding scale.  We will put     her on sliding scale and carbohydrate-modified diet. 5. Advance directives.  The patient is DNR and that will be continued     as appropriate documentation attached to the chart.     Clydia Llano, MD     ME/MEDQ  D:  02/10/2011  T:  02/10/2011  Job:  [id number]  cc:   Kandyce Rud, MD  Electronically Signed by Clydia Llano  on 02/17/2011 02:54:22 PM

## 2011-02-26 NOTE — Discharge Summary (Signed)
NAMEJUDAH, [name]                 ACCOUNT NO.:  [id number]  MEDICAL RECORD NO.:  [id number]  LOCATION:  5526                         FACILITY:  [facility]  PHYSICIAN:  Conley Canal, MD      DATE OF BIRTH:  October 24, 1917  DATE OF ADMISSION:  02/10/2011 DATE OF DISCHARGE:  02/17/2011                        DISCHARGE SUMMARY - REFERRING   PRIMARY CARE PHYSICIAN:  Kandyce Rud, MD.  DISCHARGE DIAGNOSES: 1. Acute on chronic respiratory failure with O2 saturations in the 80s     necessitating BiPAP. 2. Health care associated pneumonia. 3. Heart failure exacerbation. 4. Left shoulder pain. 5. Hypertension. 6. Rise in creatinine in the setting of stage II-III chronic kidney     disease.  The rest of her discharge diagnoses are consistent with her past medical history and include type 2 diabetes mellitus, mild dementia, hypothyroidism, depression, recent diverticulitis, hemorrhoids, stage II- II chronic kidney disease, and anemia.  This patient's code status is DNR.  HISTORY AND BRIEF HOSPITAL COURSE:  Ms. [name] is a very pleasant, mildly demented, 77 year old female.  She lives at [facility].  She was brought to the emergency room for shortness of breath on February 10, 2011.  She was started on BiPAP by the emergency department physician.  That morning,at the nursing facility she had become very short of breath and was found to be very hypoxic on room air, with O2 saturations in the low 80s.  In the emergency department, her chest x-ray showed worsened vascular congestion with retrocardiac atelectasis or infiltrate, and she had an elevated Pro-BNP of 875.  She was admitted by the [facility] to Team 8 in the stepdown unit for further evaluation and workup. 1. Health care associated pneumonia.  The patient was started on IV     antibiotics including vancomycin and cefepime.  She was monitored     closely.  She slowly improved and her antibiotics were  changed.     The cefepime was discontinued and she was changed to Levaquin.     Today, her vancomycin will be discontinued as well and she will be     discharged to skilled nursing facility on just 4 days of p.o.     Levaquin.  Her last followup chest x-ray was February 13, 2011, that     showed decreased pulmonary vascular congestion and mildly decreased     pleural effusions as well as bibasilar atelectasis.  No signs of     infiltrate.  She had a swallow evaluation done by speech therapy     during this hospitalization and found her swallow to be normal     without signs of aspiration. 2. Heart failure exacerbation.  The patient's BNP was elevated on     admission and the subsequently went up in the hospital to a maximum     of 1200 on February 12, 2011.  She was treated with diuretics and     had a 2D echo.  Her 2D echo was done on February 11, 2011, and     showed a pattern of mild LVH with a systolic ejection fraction of     60% to 65%.  There  were no wall motion abnormalities.  Features     were consistent pseudonormal left ventricular filling patterns,     grade II diastolic dysfunction.  The patient's BNP was followed.     The last time it was checked was on February 15, 2011.  It had     decreased to 570.2.  Today, the patient appears to be breathing     much more comfortably.  Her lungs sound clear and her previous     diuretic regimen will be resumed with 40 mg of Lasix on Monday,     Wednesday, and Friday, and 20 mg of Lasix every other day. 3. Rise in creatinine.  This patient has stage II-III chronic kidney     disease.  When she was first admitted, her creatinine was slightly     above her baseline.  Her lisinopril was held, and her calcium     channel blockers were increased.  With regards to both her     hypertension and her chronic kidney disease, we would request that     her BMET be monitored over the next several weeks, and that her     diuretics and lisinopril be  adjusted.  She is being discharged on a     decreased dose of lisinopril, 20 mg daily. 4. Left shoulder pain.  On February 15, 2011, the patient was found     holding her left chest complaining of pain in her shoulder.  An x-     ray was done on February 16, 2011, of her left shoulder that showed     severe degenerative changes.  No acute bony abnormality.  The     patient was started on Ultram for pain.  She has not had narcotic     pain medicines here in the hospital.  The rest of the patient's discharge diagnoses remain stable during this hospital.  She will be discharged today back to her skilled nursing facility.  PROCEDURES:  The patient had a PICC line placed on February 11, 2011, without complication.  PHYSICAL EXAMINATION:  GENERAL:  This morning, the patient is still slightly sleepy; however, she is pleasant and oriented to person. VITAL SIGNS:  Temperature is 98.0, pulse 61, respirations 20, blood pressure 115/59, O2 saturation 95% on 4 L. HEENT:  Head is atraumatic and normocephalic.  Eyes are anicteric. Pupils are equal and round.  Nose shows no nasal discharge or exterior lesions.  Mouth has moist mucous membranes with good dentition. NECK:  Supple with midline trachea.  No JVD.  No lymphadenopathy. CHEST:  Demonstrates no accessory muscle use.  She has no wheezes or crackles to my exam this morning; however, she does have a cough bringing up some white sputum. ABDOMEN:  Soft, nontender, and nondistended with regular bowel sounds. HEART:  Regular rate and rhythm without obvious murmurs, rubs, or gallops. EXTREMITIES:  No clubbing, cyanosis, or edema. SKIN:  No rashes, bruises, or lesions. NEUROLOGIC:  Cranial nerves 2 through 12 appear grossly intact.  She has no facial asymmetry.  No obvious focal neuro deficit. PSYCHIATRIC:  The patient is alert to person and place, but not time. She is pleasant and cooperative.  PERTINENT LABS:  On February 16, 2011, the patient had  a white count of 10.1, hemoglobin 8.1, hematocrit 27.4, and platelets 220.  Sodium 139, potassium 4.3, chloride 94, bicarb 40, glucose 111, BUN 23, creatinine 1.37, and serum albumin 2.6.  The patient had blood cultures drawn on February 10, 2011.  These were found to have no growth.  She had sputum culture on February 13, 2011, but unfortunately microscopic findings suggested that this specimen was not representative of low respiratory secretions.  She had a urine culture on February 10, 2011, that showed 10,000 colonies of pseudomonas and coagulase negative staph.  This was sensitive to cefepime.  The patient did have cefepime IV for approximately 3 days.  RADIOLOGIC EXAM:  On February 10, 2011, the patient had a portable chest x-ray that showed worsened vascular congestion with possible mild edema and new retrocardiac atelectasis or infiltrate.  Her last followup chest x-ray was February 13, 2011, that showed decreased pulmonary vascular congestion.  She had an x-ray of her left shoulder on February 16, 2011, that showed severe degenerative changes and no acute bony abnormality.  DISCHARGE MEDICATIONS: 1. Amlodipine 10 mg 1 tablet by mouth daily. 2. Ferrous sulfate 325 mg 1 tablet by mouth twice daily. 3. Guaifenesin 600 mg 2 tablets by mouth twice daily. 4. Levofloxacin 750 mg 1 tablet by mouth twice daily, take for 4 days. 5. Polyethylene glycol 3350, 17 g by mouth daily. 6. Ultram 50 mg 1 tablet by mouth 3 times a day. 7. Zolpidem 5 mg by mouth daily at bedtime. 8. Lisinopril 20 mg 1 pill by mouth once a day. 9. Hydrocodone/acetaminophen 5/325 one tablet by mouth every 4 hours     as needed p.r.n. pain. 10.Aspirin 81 mg 1 tablet by mouth daily. 11.Albuterol 2.5 mg in 3 mL nebulizer solution 1 nebulizer inhaled     every 2 hours as needed for shortness of breath. 12.Calcium carbonate with vitamin D 600 mg/200 internation units 1     tablet by mouth daily. 13.Citalopram 20 mg 1  tablet by mouth daily. 14.Glyburide 1.25 mg 1 tablet by mouth daily. 15.Insulin NovoLog FlexPen 5-15 units subcutaneously 3 times a day     with meals. 16.Lantus 10 units subcutaneously daily at bedtime. 17.Lasix 20 mg on Tuesday, Thursday, Saturday, and Sunday. 18.Lasix 40 mg 1 tablet by mouth every Monday, Wednesday, and Friday. 19.Levothyroxine 125 mcg 1 tablet by mouth daily. 20.Melatonin 3 mg over-the-counter 1 tablet by mouth daily at bedtime. 21.Simvastatin 20 mg 1 tablet by mouth daily. 22.Tylenol 325 mg 2 by mouth every 6 hours as needed for pain.  DISCHARGE INSTRUCTIONS:  The patient will be discharged to the skilled nursing facility.  Activity will be per physical therapy and occupational therapy at [facility].  Diet is low-sodium, heart-healthy. Followup appointments, the patient needs to be seen by the primary care physician within 3-5 days.  We would suggest that a BMET be drawn at that time to monitor both her potassium and her creatinine.  Please note the patient's blood pressure medications have been changed and may require further readjustment pending her medical resolution.     Stephani Police, PA   ______________________________ Conley Canal, MD    MLY/MEDQ  D:  02/17/2011  T:  02/17/2011  Job:  [id number]  cc:   Kandyce Rud, MD [facility]  Electronically Signed by Algis Downs PA on 02/21/2011 09:02:37 AM Electronically Signed by Conley Canal  on 02/26/2011 07:45:00 PM

## 2011-03-05 ENCOUNTER — Ambulatory Visit (HOSPITAL_COMMUNITY): Payer: Medicare Other | Attending: Internal Medicine

## 2011-03-05 ENCOUNTER — Emergency Department (HOSPITAL_COMMUNITY)
Admission: EM | Admit: 2011-03-05 | Discharge: 2011-03-05 | Disposition: A | Discharge status: Other Acute Inpatient Hospital | Payer: Medicare Other | Attending: Emergency Medicine | Admitting: Emergency Medicine

## 2011-03-05 ENCOUNTER — Emergency Department (HOSPITAL_COMMUNITY): Payer: Medicare Other

## 2011-03-05 ENCOUNTER — Other Ambulatory Visit: Payer: Self-pay | Admitting: Internal Medicine

## 2011-03-05 DIAGNOSIS — E789 Disorder of lipoprotein metabolism, unspecified: Secondary | ICD-10-CM | POA: Insufficient documentation

## 2011-03-05 DIAGNOSIS — D649 Anemia, unspecified: Secondary | ICD-10-CM | POA: Insufficient documentation

## 2011-03-05 DIAGNOSIS — R059 Cough, unspecified: Secondary | ICD-10-CM | POA: Insufficient documentation

## 2011-03-05 DIAGNOSIS — Z794 Long term (current) use of insulin: Secondary | ICD-10-CM | POA: Insufficient documentation

## 2011-03-05 DIAGNOSIS — D509 Iron deficiency anemia, unspecified: Secondary | ICD-10-CM | POA: Insufficient documentation

## 2011-03-05 DIAGNOSIS — I10 Essential (primary) hypertension: Secondary | ICD-10-CM | POA: Insufficient documentation

## 2011-03-05 DIAGNOSIS — Z79899 Other long term (current) drug therapy: Secondary | ICD-10-CM | POA: Insufficient documentation

## 2011-03-05 DIAGNOSIS — E119 Type 2 diabetes mellitus without complications: Secondary | ICD-10-CM | POA: Insufficient documentation

## 2011-03-05 DIAGNOSIS — I509 Heart failure, unspecified: Secondary | ICD-10-CM | POA: Insufficient documentation

## 2011-03-05 DIAGNOSIS — R0602 Shortness of breath: Secondary | ICD-10-CM | POA: Insufficient documentation

## 2011-03-05 DIAGNOSIS — R609 Edema, unspecified: Secondary | ICD-10-CM | POA: Insufficient documentation

## 2011-03-05 DIAGNOSIS — Z950 Presence of cardiac pacemaker: Secondary | ICD-10-CM | POA: Insufficient documentation

## 2011-03-05 DIAGNOSIS — R05 Cough: Secondary | ICD-10-CM | POA: Insufficient documentation

## 2011-03-05 DIAGNOSIS — R0989 Other specified symptoms and signs involving the circulatory and respiratory systems: Secondary | ICD-10-CM | POA: Insufficient documentation

## 2011-03-05 DIAGNOSIS — E039 Hypothyroidism, unspecified: Secondary | ICD-10-CM | POA: Insufficient documentation

## 2011-03-05 DIAGNOSIS — R1110 Vomiting, unspecified: Secondary | ICD-10-CM | POA: Insufficient documentation

## 2011-03-05 LAB — COMPREHENSIVE METABOLIC PANEL
BUN: 33 mg/dL — ABNORMAL HIGH (ref 6–23)
CO2: 35 mEq/L — ABNORMAL HIGH (ref 19–32)
Chloride: 101 mEq/L (ref 96–112)
Creatinine, Ser: 1.17 mg/dL — ABNORMAL HIGH (ref 0.50–1.10)
GFR calc non Af Amer: 39 mL/min — ABNORMAL LOW (ref >90)
Glucose, Bld: 153 mg/dL — ABNORMAL HIGH (ref 70–99)
Total Bilirubin: 0.3 mg/dL (ref 0.3–1.2)

## 2011-03-05 LAB — DIFFERENTIAL
Basophils Relative: 1 % (ref 0–1)
Lymphocytes Relative: 13 % (ref 12–46)
Monocytes Absolute: 0.7 10*3/uL (ref 0.1–1.0)
Monocytes Relative: 7 % (ref 3–12)
Neutro Abs: 7.3 10*3/uL (ref 1.7–7.7)

## 2011-03-05 LAB — CBC
HCT: 30.2 % — ABNORMAL LOW (ref 36.0–46.0)
Hemoglobin: 8.8 g/dL — ABNORMAL LOW (ref 12.0–15.0)
MCH: 25.6 pg — ABNORMAL LOW (ref 26.0–34.0)
MCHC: 29.1 g/dL — ABNORMAL LOW (ref 30.0–36.0)

## 2011-03-05 LAB — PRO B NATRIURETIC PEPTIDE: Pro B Natriuretic peptide (BNP): 739.9 pg/mL — ABNORMAL HIGH (ref 0–450)

## 2011-03-06 LAB — CROSSMATCH
ABO/RH(D): O POS
Unit division: 0

## 2012-08-04 IMAGING — CT CT ABD-PELV W/O CM
2 of 3 series · 17 of 46 positions shown, 19 images · non-contrast
Comparison: 01/09/2011

CLINICAL DATA: Diverticular abscess.

CT ABDOMEN AND PELVIS WITHOUT CONTRAST
TECHNIQUE: Multidetector CT imaging of the abdomen and pelvis was
performed following the standard protocol without intravenous
contrast.

[Series 2: rtn a/p w/o · axial · non-contrast · 0.83mm/px · z∈[-451,-81]mm · 14 of 86 slices shown, 16 images]
[im 6/86  soft-tissue]
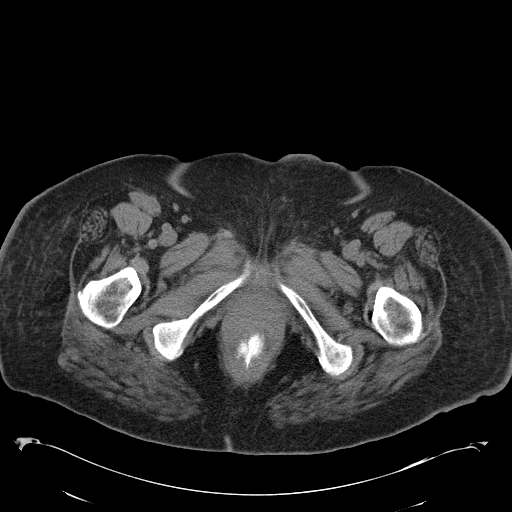
[im 6/86  bone]
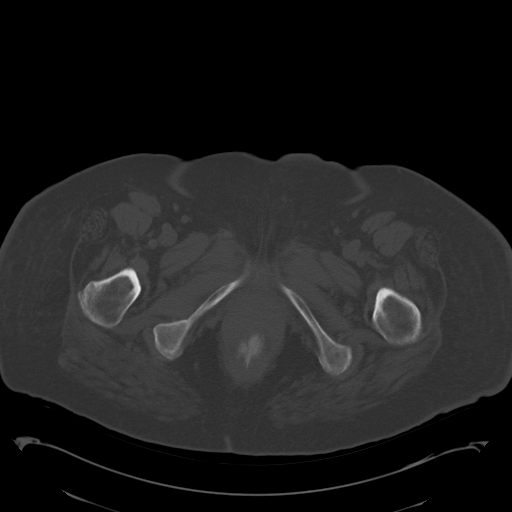
[im 11/86  soft-tissue]
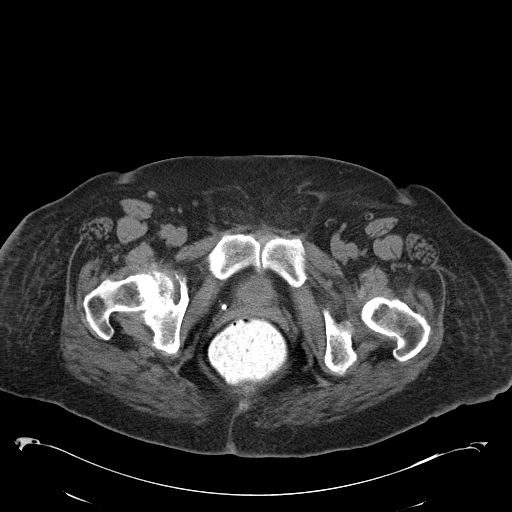
[im 17/86  soft-tissue]
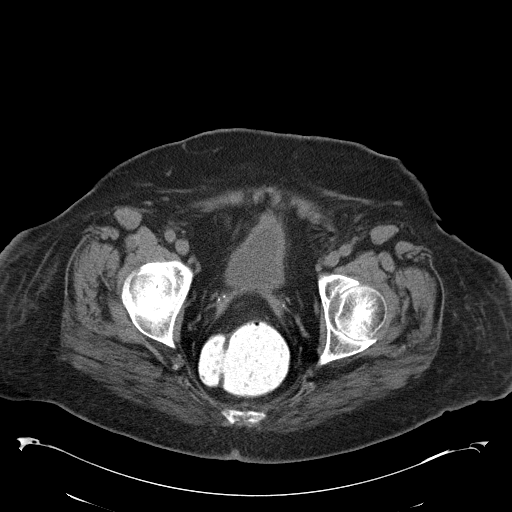
[im 22/86  soft-tissue]
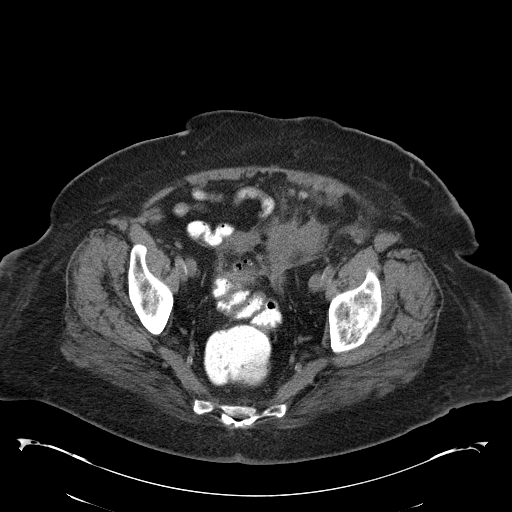
[im 28/86  soft-tissue]
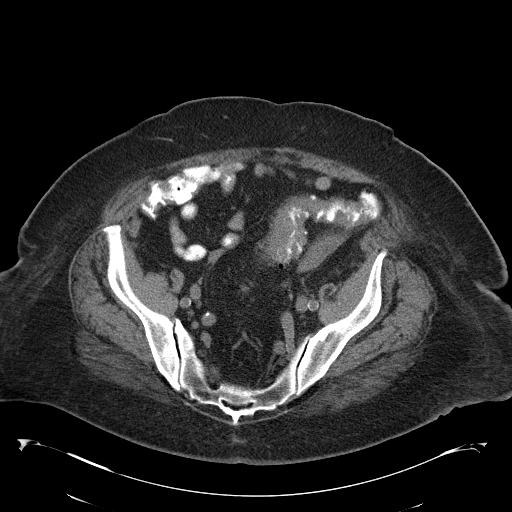
[im 33/86  soft-tissue]
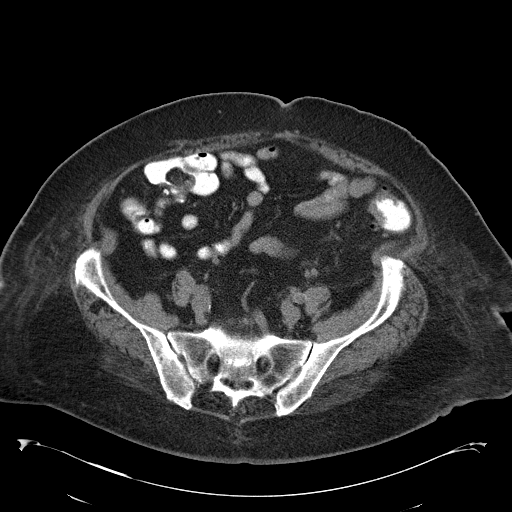
[im 39/86  soft-tissue]
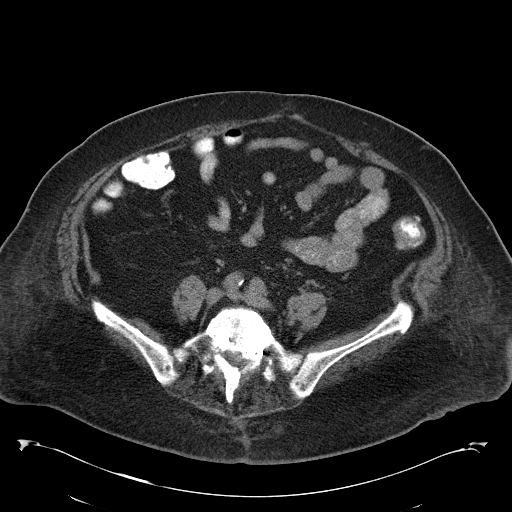
[im 47/86  soft-tissue]
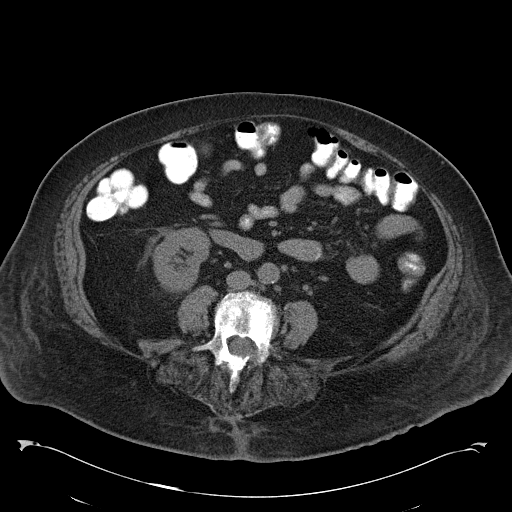
[im 53/86  soft-tissue]
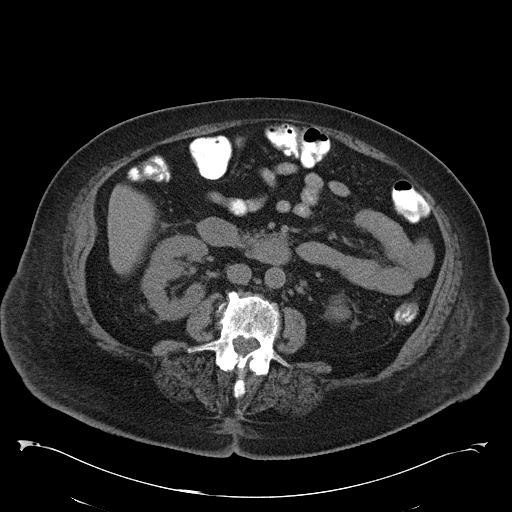
[im 53/86  bone]
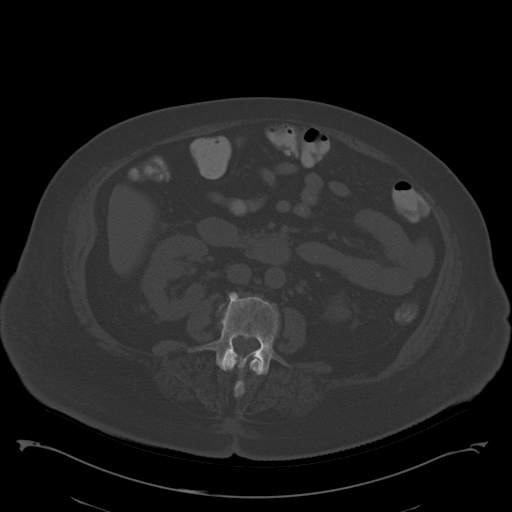
[im 58/86  soft-tissue]
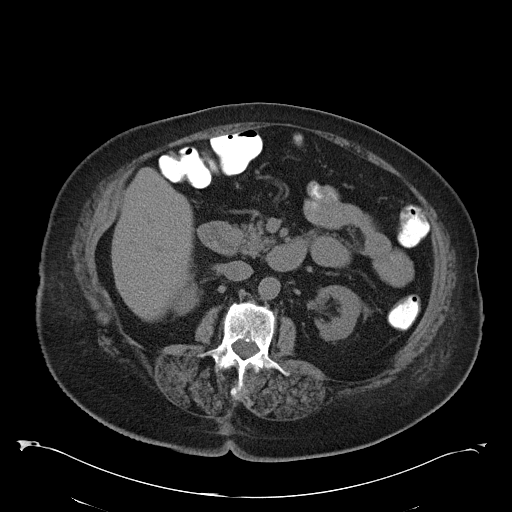
[im 64/86  soft-tissue]
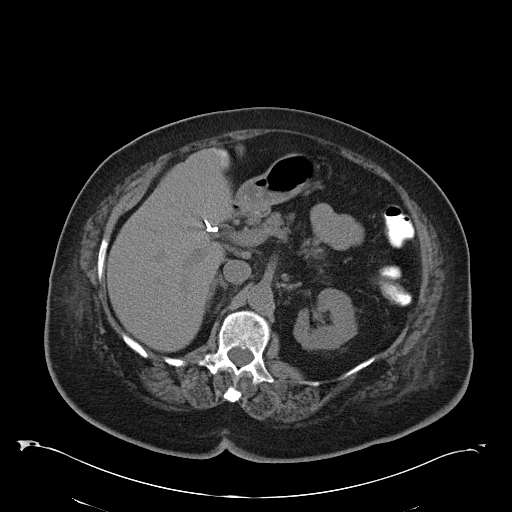
[im 69/86  soft-tissue]
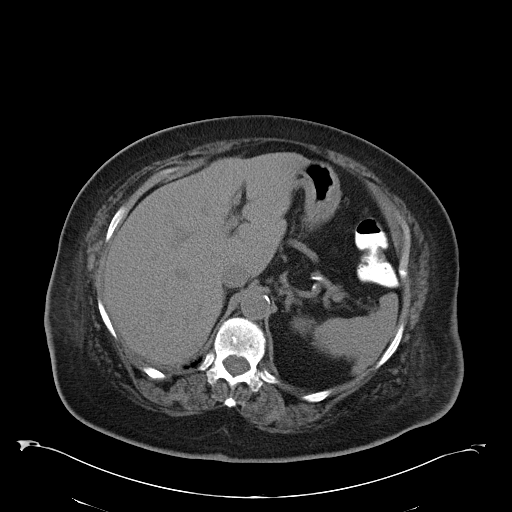
[im 75/86  soft-tissue]
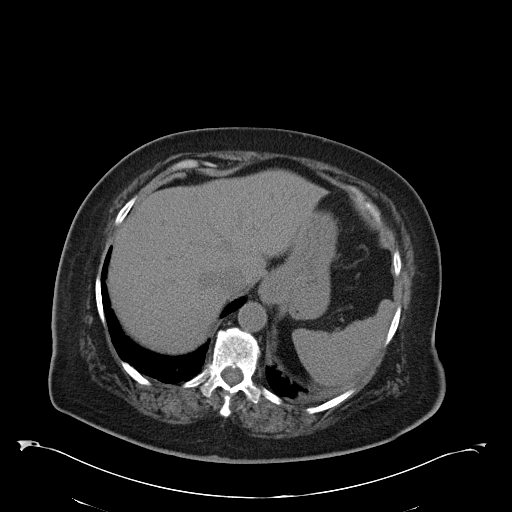
[im 80/86  soft-tissue]
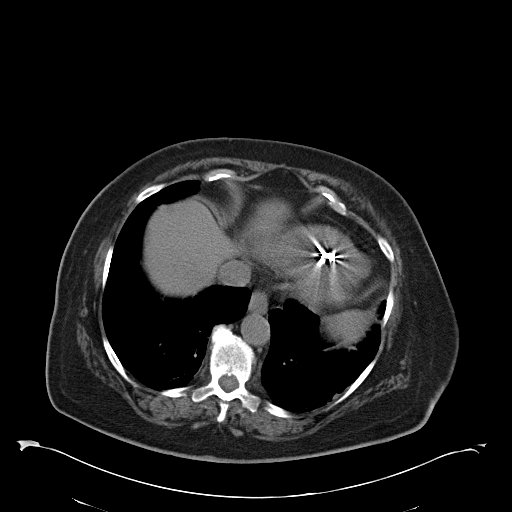

[Series 602: cor · coronal · 0.83mm/px · 3 of 143 slices shown]
[im 48/143  soft-tissue]
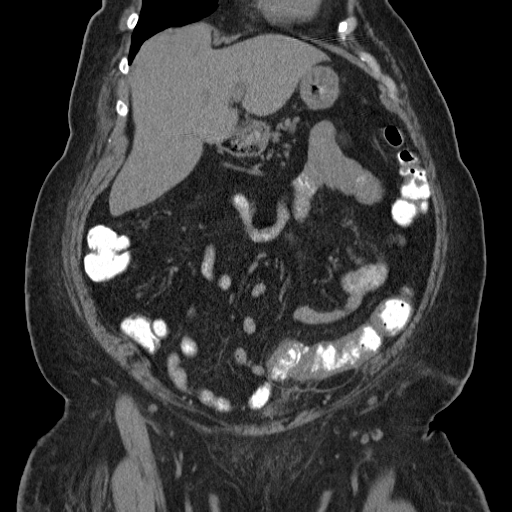
[im 64/143  soft-tissue]
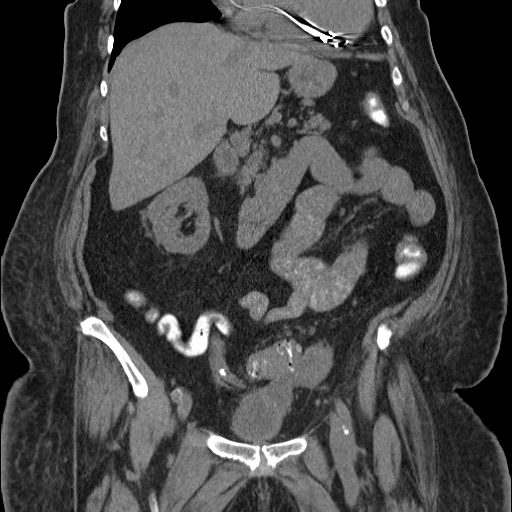
[im 79/143  soft-tissue]
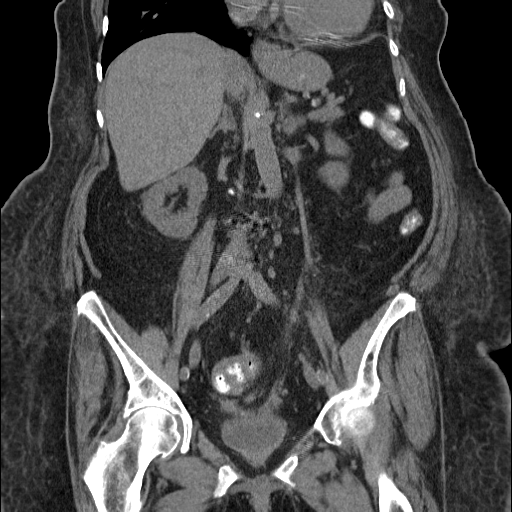

[17 of 46 positions shown; findings below may reference images not displayed]

FINDINGS: The liver and spleen have normal uninfused features.
Stomach, duodenum, pancreas, and adrenal glands are normal.
Gallbladder is surgically absent.
Kidneys are unremarkable.

No abdominal aortic aneurysm.  There is no free fluid or
lymphadenopathy in the abdomen.  No evidence for bowel obstruction.

Imaging through the pelvis shows no free intraperitoneal fluid.
The uterus is surgically absent.  Bladder is unremarkable.

Diffuse wall thickening in the sigmoid colon is compatible with
diverticulitis.  The left-sided percutaneous drain has been removed
in the interval.  A 3.9 x 2.5 cm fluid collection in the sigmoid
mesocolon is decreased from 4.7 x 3.2 cm previously.  This
represents the collection in which the catheter tip was coiled on
the previous study.  Just caudal to this collection is a second
x 2.0 cm collection which is contiguous with the bladder dome.
This collection has decreased from 4.6 x 3.2 cm previously.

The terminal ileum is normal.
The appendix is not visualized, but there is no edema or
inflammation in the region of the cecum.

Postsurgical changes are seen in the lower lumbar spine.
IMPRESSION: Interval decrease in both previously characterized small fluid
collections in the left pelvis.

Persistent wall thickening in the sigmoid colon compatible with
sequelae of diverticulitis.

## 2012-08-26 IMAGING — CR DG CHEST 1V PORT
1 series · 1 of 1 positions shown · non-contrast
Comparison: 02/11/2011 and earlier.

CLINICAL DATA: [AGE] female with cough, congestion, weakness.

PORTABLE CHEST - 1 VIEW

[view not recorded]
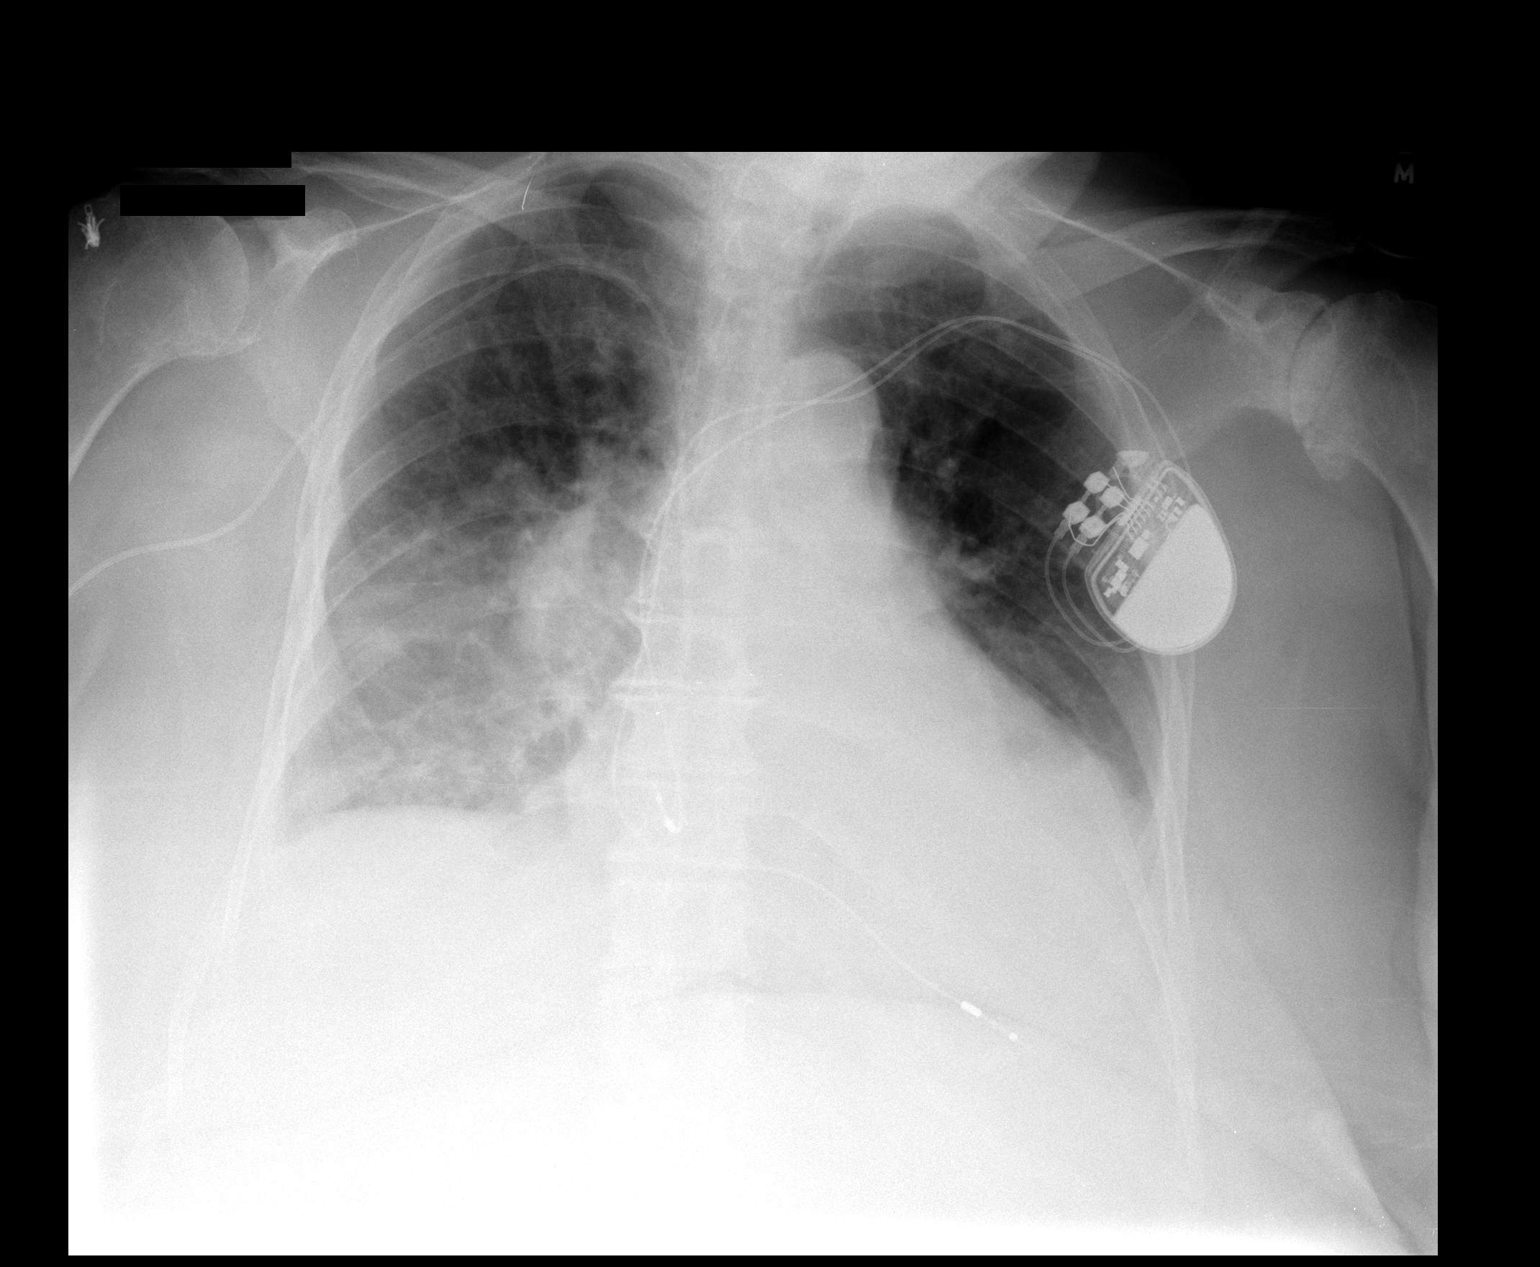

[1 of 1 positions shown; findings below may reference images not displayed]

FINDINGS: AP portable seated view at 9909 hours.  Veiling opacity
at both lung bases mildly improved.  Pulmonary vascular congestion
appears decreased.  No pneumothorax.  Continued dense retrocardiac
opacity.  Stable cardiac size and mediastinal contours.  Stable
right PICC line.  Left chest cardiac pacemaker.
IMPRESSION: 1.  Decreased pulmonary vascular congestion.  Mildly decreased
bilateral pleural effusions.
2.  Bibasilar atelectasis.

## 2012-08-29 IMAGING — CR DG SHOULDER 2+V*L*
3 series · 3 of 3 positions shown · non-contrast
Comparison: 12/20/2005

CLINICAL DATA: Left shoulder pain.  No injury.

LEFT SHOULDER - 2+ VIEW

[t shoulder ap internal left]
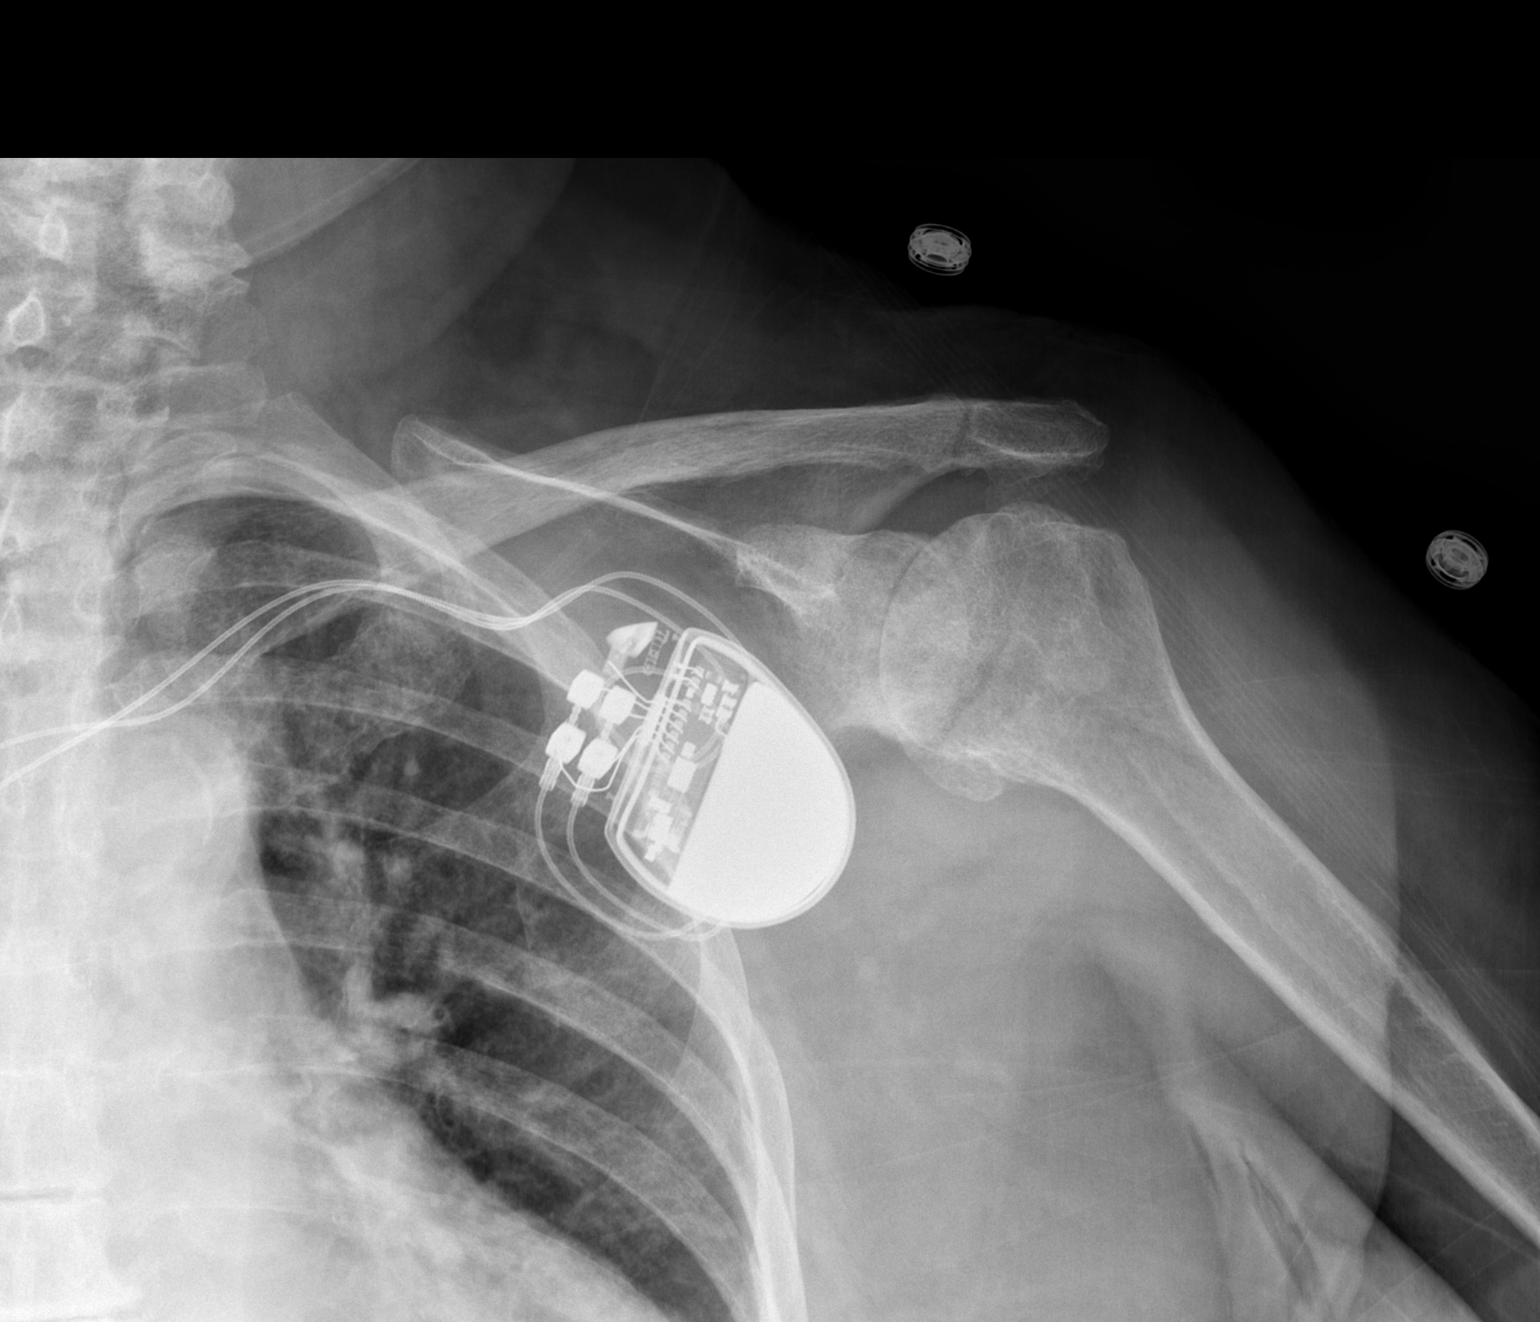

[t shoulder ap external left]
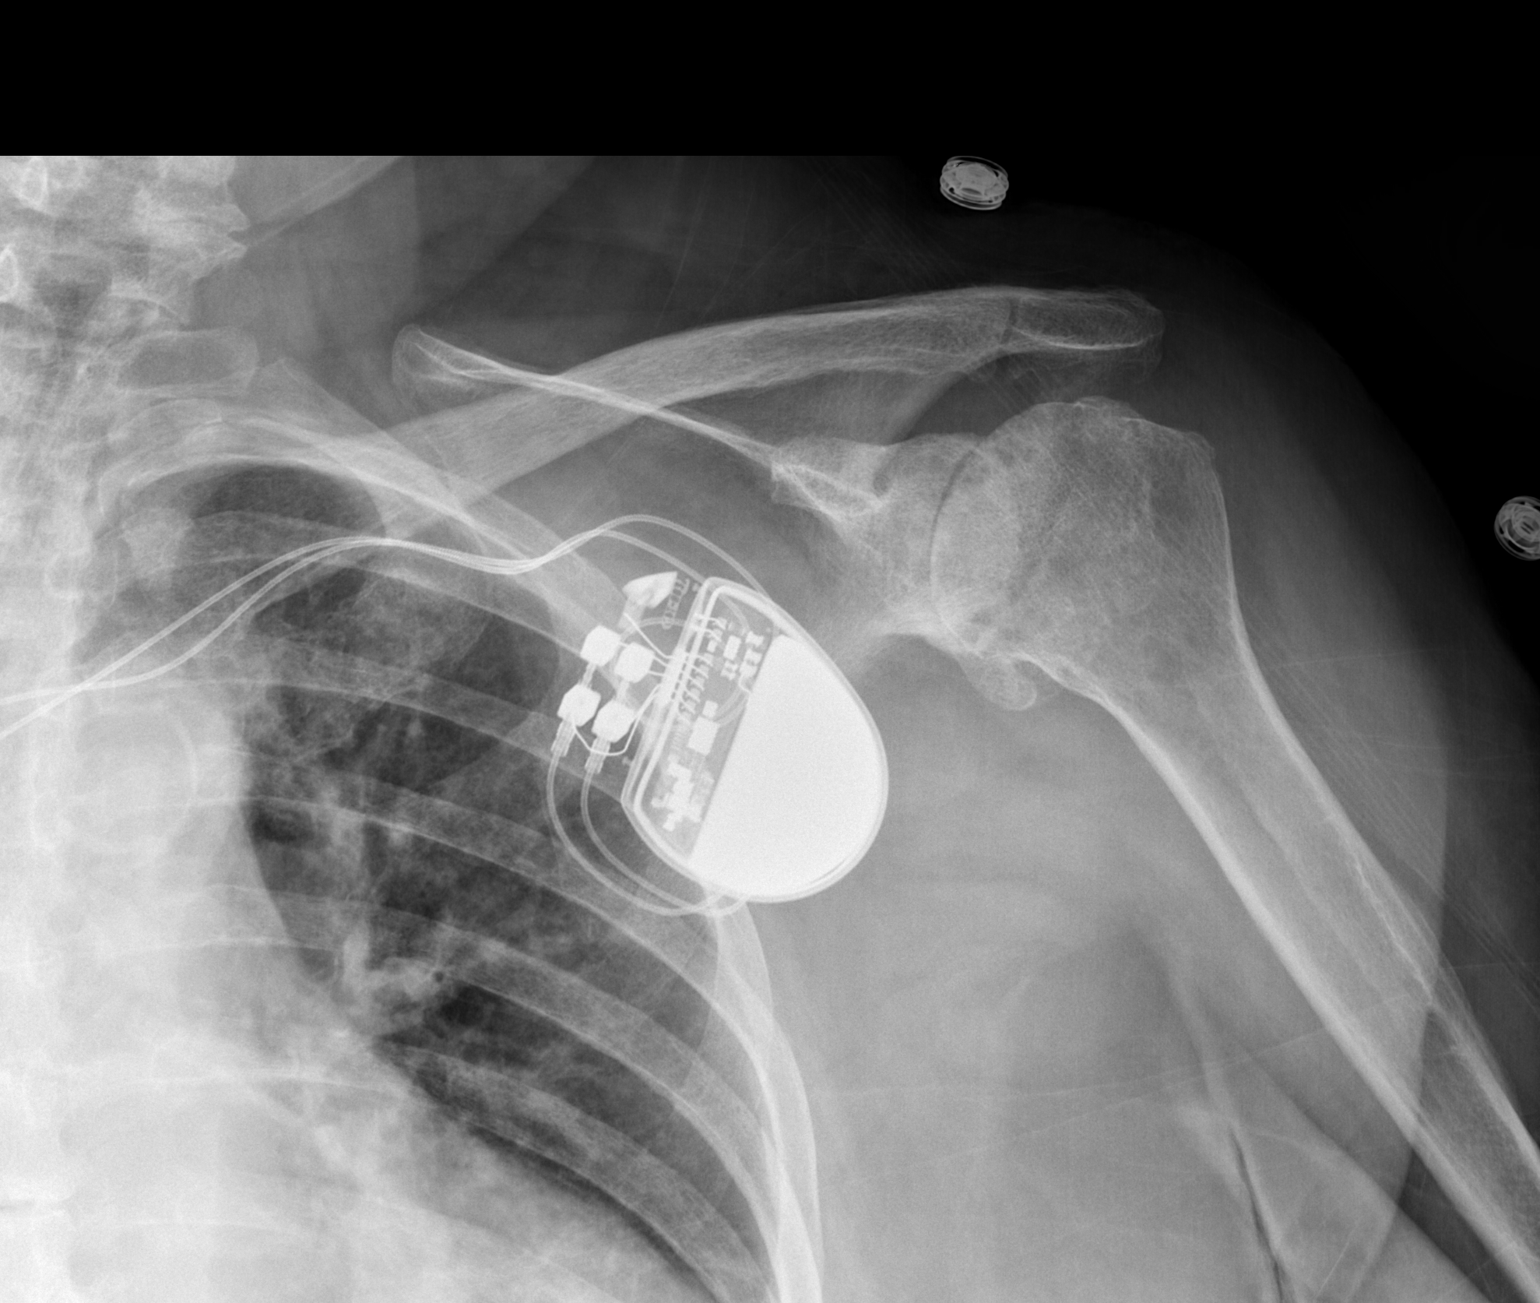

[t shoulder y view left *]
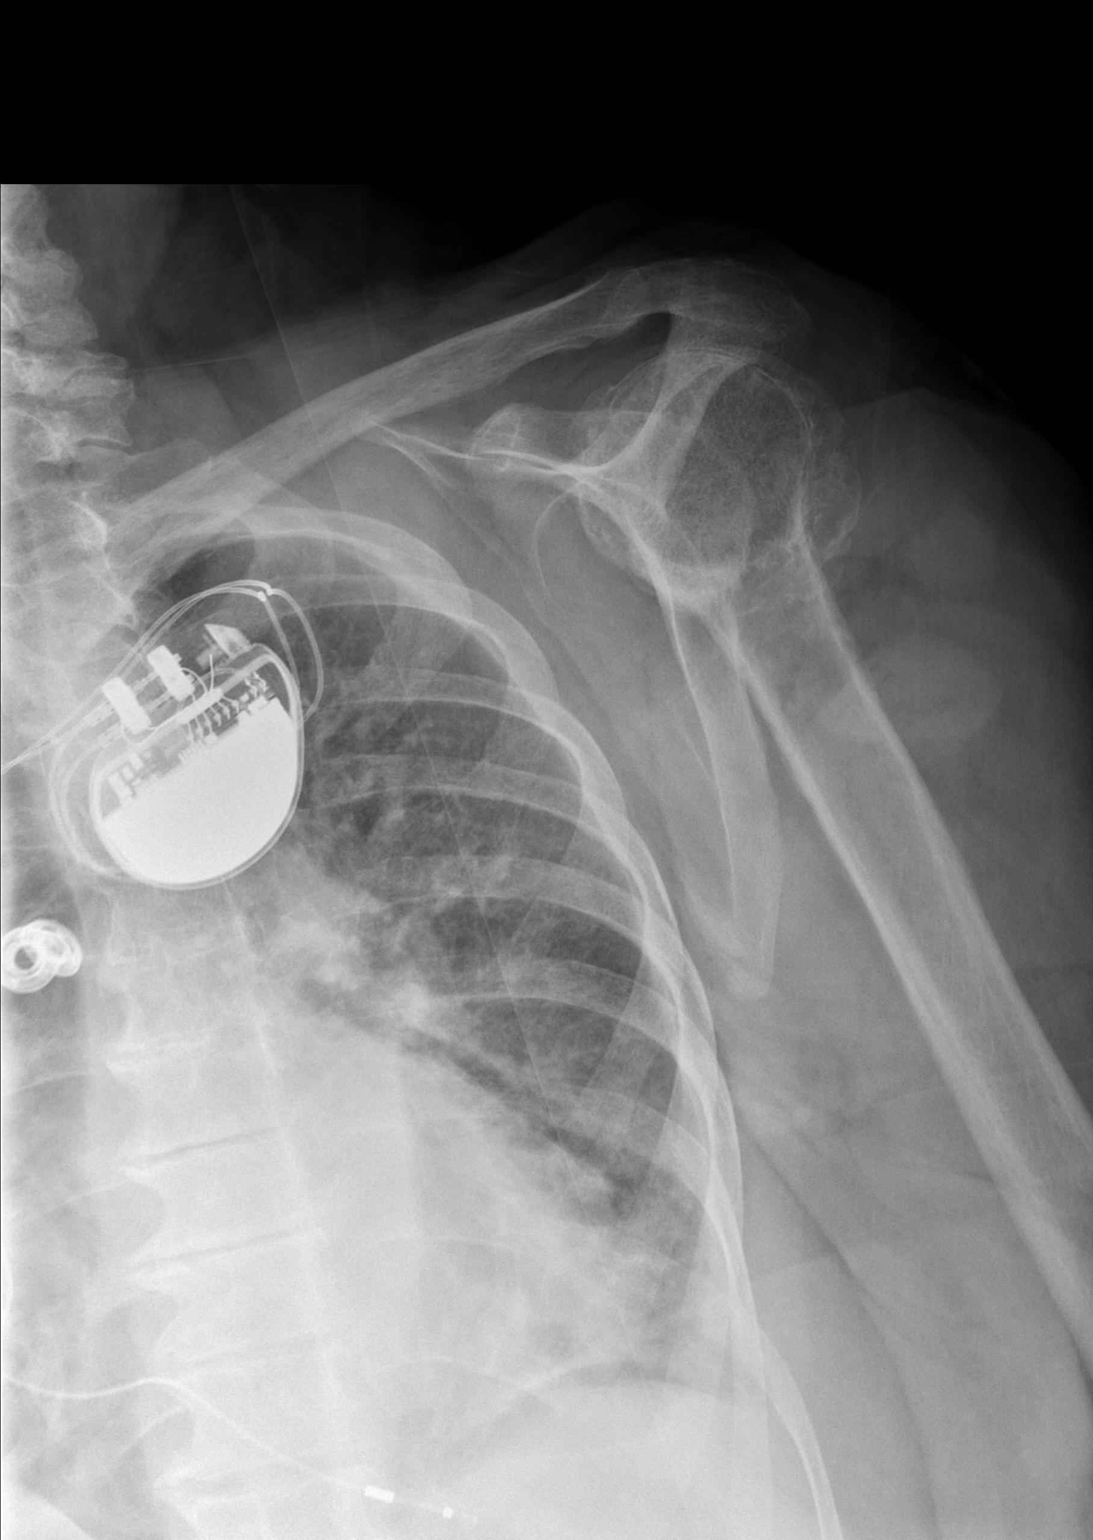

[3 of 3 positions shown; findings below may reference images not displayed]

FINDINGS: Severe arthritic changes in the left glenohumeral joint
with complete loss of joint space, exuberant osteophyte formation,
subchondral cyst formation. No acute bony abnormality.
Specifically, no fracture, subluxation, or dislocation.  Soft
tissues are intact.
IMPRESSION: Severe degenerative changes. No acute bony abnormality.

## 2013-04-26 ENCOUNTER — Encounter: Payer: Self-pay | Admitting: Internal Medicine

## 2013-05-05 DEATH — deceased

## 2013-05-09 ENCOUNTER — Telehealth: Payer: Self-pay

## 2013-05-09 NOTE — Telephone Encounter (Signed)
Patient past away @ [facility] per [facility] in [place] [employer] °
